# Patient Record
Sex: Male | Born: 1975 | Race: Asian | Hispanic: No | Marital: Married | Smoking: Former smoker
Health system: Southern US, Community
[De-identification: ages and names within clinical notes are randomized; demographics above are authoritative.]

## PROBLEM LIST (undated history)

## (undated) DIAGNOSIS — I712 Thoracic aortic aneurysm, without rupture, unspecified: Secondary | ICD-10-CM

## (undated) DIAGNOSIS — F329 Major depressive disorder, single episode, unspecified: Secondary | ICD-10-CM

## (undated) DIAGNOSIS — F419 Anxiety disorder, unspecified: Secondary | ICD-10-CM

## (undated) DIAGNOSIS — T7840XA Allergy, unspecified, initial encounter: Secondary | ICD-10-CM

## (undated) DIAGNOSIS — F32A Depression, unspecified: Secondary | ICD-10-CM

## (undated) DIAGNOSIS — G4730 Sleep apnea, unspecified: Secondary | ICD-10-CM

## (undated) DIAGNOSIS — I71019 Dissection of thoracic aorta, unspecified: Secondary | ICD-10-CM

## (undated) DIAGNOSIS — I7101 Dissection of thoracic aorta: Secondary | ICD-10-CM

## (undated) DIAGNOSIS — E785 Hyperlipidemia, unspecified: Secondary | ICD-10-CM

## (undated) DIAGNOSIS — I10 Essential (primary) hypertension: Secondary | ICD-10-CM

## (undated) HISTORY — DX: Dissection of thoracic aorta: I71.01

## (undated) HISTORY — DX: Allergy, unspecified, initial encounter: T78.40XA

## (undated) HISTORY — DX: Depression, unspecified: F32.A

## (undated) HISTORY — DX: Sleep apnea, unspecified: G47.30

## (undated) HISTORY — PX: CARDIAC VALVE REPLACEMENT: SHX585

## (undated) HISTORY — DX: Anxiety disorder, unspecified: F41.9

## (undated) HISTORY — DX: Dissection of thoracic aorta, unspecified: I71.019

## (undated) HISTORY — DX: Thoracic aortic aneurysm, without rupture: I71.2

## (undated) HISTORY — DX: Hyperlipidemia, unspecified: E78.5

## (undated) HISTORY — DX: Essential (primary) hypertension: I10

## (undated) HISTORY — DX: Thoracic aortic aneurysm, without rupture, unspecified: I71.20

---
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
## 2017-06-13 DIAGNOSIS — E559 Vitamin D deficiency, unspecified: Secondary | ICD-10-CM | POA: Insufficient documentation

## 2017-10-06 DIAGNOSIS — I351 Nonrheumatic aortic (valve) insufficiency: Secondary | ICD-10-CM | POA: Insufficient documentation

## 2017-11-03 DIAGNOSIS — K760 Fatty (change of) liver, not elsewhere classified: Secondary | ICD-10-CM

## 2017-11-03 DIAGNOSIS — I7121 Aneurysm of the ascending aorta, without rupture: Secondary | ICD-10-CM | POA: Insufficient documentation

## 2017-11-03 HISTORY — DX: Fatty (change of) liver, not elsewhere classified: K76.0

## 2017-11-21 HISTORY — PX: OTHER SURGICAL HISTORY: SHX169

## 2017-12-03 DIAGNOSIS — Z9889 Other specified postprocedural states: Secondary | ICD-10-CM | POA: Insufficient documentation

## 2017-12-03 DIAGNOSIS — Z7901 Long term (current) use of anticoagulants: Secondary | ICD-10-CM | POA: Insufficient documentation

## 2017-12-03 DIAGNOSIS — Z8679 Personal history of other diseases of the circulatory system: Secondary | ICD-10-CM

## 2017-12-03 HISTORY — DX: Other specified postprocedural states: Z98.890

## 2017-12-03 HISTORY — DX: Personal history of other diseases of the circulatory system: Z86.79

## 2017-12-06 DIAGNOSIS — Z952 Presence of prosthetic heart valve: Secondary | ICD-10-CM | POA: Insufficient documentation

## 2019-03-06 ENCOUNTER — Ambulatory Visit: Payer: Self-pay | Admitting: Family Medicine

## 2019-03-12 ENCOUNTER — Ambulatory Visit: Payer: BC Managed Care – PPO | Admitting: Family Medicine

## 2019-03-12 ENCOUNTER — Encounter: Payer: Self-pay | Admitting: Family Medicine

## 2019-03-12 VITALS — BP 112/78 | HR 68 | Temp 98.2°F | Ht 66.0 in | Wt 180.8 lb

## 2019-03-12 DIAGNOSIS — E236 Other disorders of pituitary gland: Secondary | ICD-10-CM

## 2019-03-12 DIAGNOSIS — E785 Hyperlipidemia, unspecified: Secondary | ICD-10-CM

## 2019-03-12 DIAGNOSIS — Z952 Presence of prosthetic heart valve: Secondary | ICD-10-CM

## 2019-03-12 DIAGNOSIS — Z7901 Long term (current) use of anticoagulants: Secondary | ICD-10-CM

## 2019-03-12 DIAGNOSIS — I719 Aortic aneurysm of unspecified site, without rupture: Secondary | ICD-10-CM

## 2019-03-12 DIAGNOSIS — I7121 Aneurysm of the ascending aorta, without rupture: Secondary | ICD-10-CM

## 2019-03-12 DIAGNOSIS — F418 Other specified anxiety disorders: Secondary | ICD-10-CM | POA: Insufficient documentation

## 2019-03-12 HISTORY — DX: Hyperlipidemia, unspecified: E78.5

## 2019-03-12 HISTORY — DX: Presence of prosthetic heart valve: Z95.2

## 2019-03-12 NOTE — Progress Notes (Signed)
Larry Parks - 43 y.o. male MRN [id number]  Date of birth: 02-11-1976  Subjective Chief Complaint  Patient presents with  . Establish Care    est care/ no complaints    HPI Larry Parks is a 43 y.o. 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  He reports that his chronic medical conditions are stable.  He needs to establish with cardiology for anticoagulation management and continued monitoring of AVR.  His last INR was 02/20/2019 and was 2.2.    He also reports that shortly after his surgery he had some dizziness and ended up in the ER.  He had and MRI that showed pituitary abnormality.  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  He has not had any symptoms related to this and endocrine labs were normal previously.    ROS:  A comprehensive ROS was completed and negative except as noted per HPI  No Known Allergies  Past Medical History:  Diagnosis Date  . Hyperlipidemia     Past Surgical History:  Procedure Laterality Date  . aortic valve replaced      Social History   Socioeconomic History  . Marital status: Married    Spouse name: Not on file  . Number of children: Not on file  . Years of education: Not on file  . Highest education level: Not on file  Occupational History  . Not on file  Social Needs  . Financial resource strain: Not on file  . Food insecurity    Worry: Not on file    Inability: Not on file  . Transportation needs    Medical: Not on file    Non-medical: Not on file  Tobacco Use  . Smoking status: Former Smoker    Quit date: 2004    Years since quitting: 16.5  . Smokeless tobacco: Never Used  Substance and Sexual Activity  . Alcohol use: Never    Frequency: Never  . Drug use: Never  . Sexual activity: Not on file  Lifestyle  . Physical activity    Days per week: Not on file    Minutes per  session: Not on file  . Stress: Not on file  Relationships  . Social Herbalist on phone: Not on file    Gets together: Not on file    Attends religious service: Not on file    Active member of club or organization: Not on file    Attends meetings of clubs or organizations: Not on file    Relationship status: Not on file  Other Topics Concern  . Not on file  Social History Narrative  . Not on file    Family History  Problem Relation Age of Onset  . Diabetes Father   . Heart attack Father     Health Maintenance  Topic Date Due  . HIV Screening  08/28/1988  . INFLUENZA VACCINE  03/24/2019  . TETANUS/TDAP  02/01/2027    ----------------------------------------------------------------------------------------------------------------------------------------------------------------------------------------------------------------- Physical Exam There were no vitals taken for this visit.  Physical Exam Constitutional:      Appearance: Normal appearance.  HENT:     Head: Normocephalic and atraumatic.     Mouth/Throat:     Mouth: Mucous membranes are moist.  Eyes:     General: No scleral icterus. Neck:     Musculoskeletal: Neck supple.  Cardiovascular:  Rate and Rhythm: Normal rate and regular rhythm.     Comments: Mechanical AV click Pulmonary:     Effort: Pulmonary effort is normal.     Breath sounds: Normal breath sounds.  Skin:    General: Skin is warm and dry.  Neurological:     General: No focal deficit present.     Mental Status: He is alert.  Psychiatric:        Mood and Affect: Mood normal.        Behavior: Behavior normal.     ------------------------------------------------------------------------------------------------------------------------------------------------------------------------------------------------------------------- Assessment and Plan  Aortic root aneurysm ([place]) -Referral to cardiology as he was instructed to have 1 year  f/u after AVR.  -Will also need ongoing anticoagulation monitoring.  -  Pituitary cyst ([place]) -Will continue to monitor with routine monitoring of pituitary hormones.    Hyperlipidemia -Tolerating zetia, continue.   Depression with anxiety -taking 1/2 tab of lexapro 10mg .  Never took lorazepam.  He feels that symptoms are well controlled at this time.   >45 minutes spent with patient with 50% of time spent providing counseling and/or coordination of care through review of records and referrals as outlined above.

## 2019-03-12 NOTE — Patient Instructions (Signed)
Very nice to meet you! Continue current medications and see me again in 6 months.  If you do not hear from cardiology/warfarin clinic in the next couple of weeks let me know.

## 2019-03-12 NOTE — Assessment & Plan Note (Signed)
-  taking 1/2 tab of lexapro 10mg .  Never took lorazepam.  He feels that symptoms are well controlled at this time.

## 2019-03-12 NOTE — Assessment & Plan Note (Signed)
-  Referral to cardiology as he was instructed to have 1 year f/u after AVR.  -Will also need ongoing anticoagulation monitoring.  -

## 2019-03-12 NOTE — Assessment & Plan Note (Signed)
-  Tolerating zetia, continue.

## 2019-03-12 NOTE — Assessment & Plan Note (Signed)
-  Will continue to monitor with routine monitoring of pituitary hormones.

## 2019-03-21 ENCOUNTER — Ambulatory Visit (INDEPENDENT_AMBULATORY_CARE_PROVIDER_SITE_OTHER): Payer: BC Managed Care – PPO | Admitting: Cardiology

## 2019-03-21 ENCOUNTER — Other Ambulatory Visit: Payer: Self-pay

## 2019-03-21 ENCOUNTER — Encounter: Payer: Self-pay | Admitting: Cardiology

## 2019-03-21 VITALS — BP 108/64 | HR 64 | Resp 10 | Ht 66.0 in | Wt 180.8 lb

## 2019-03-21 DIAGNOSIS — Z7901 Long term (current) use of anticoagulants: Secondary | ICD-10-CM

## 2019-03-21 DIAGNOSIS — I719 Aortic aneurysm of unspecified site, without rupture: Secondary | ICD-10-CM

## 2019-03-21 DIAGNOSIS — E785 Hyperlipidemia, unspecified: Secondary | ICD-10-CM | POA: Diagnosis not present

## 2019-03-21 DIAGNOSIS — Z952 Presence of prosthetic heart valve: Secondary | ICD-10-CM

## 2019-03-21 DIAGNOSIS — F418 Other specified anxiety disorders: Secondary | ICD-10-CM | POA: Diagnosis not present

## 2019-03-21 DIAGNOSIS — I7121 Aneurysm of the ascending aorta, without rupture: Secondary | ICD-10-CM

## 2019-03-21 NOTE — Patient Instructions (Addendum)
Medication Instructions:  Your physician recommends that you continue on your current medications as directed. Please refer to the Current Medication list given to you today.  If you need a refill on your cardiac medications before your next appointment, please call your pharmacy.   Lab work: Your physician recommends that you return for lab work today: lipids, bmp, inr   If you have labs (blood work) drawn today and your tests are completely normal, you will receive your results only by: Marland Kitchen MyChart Message (if you have MyChart) OR . A paper copy in the mail If you have any lab test that is abnormal or we need to change your treatment, we will call you to review the results.  Testing/Procedures: Your physician has requested that you have an echocardiogram. Echocardiography is a painless test that uses sound waves to create images of your heart. It provides your doctor with information about the size and shape of your heart and how well your heart's chambers and valves are working. This procedure takes approximately one hour. There are no restrictions for this procedure.  Non-Cardiac CT scanning, (CAT scanning), is a noninvasive, special x-ray that produces cross-sectional images of the body using x-rays and a computer. CT scans help physicians diagnose and treat medical conditions. For some CT exams, a contrast material is used to enhance visibility in the area of the body being studied. CT scans provide greater clarity and reveal more details than regular x-ray exams.    Follow-Up: At [facility], you and your health needs are our priority.  As part of our continuing mission to provide you with exceptional heart care, we have created designated Provider Care Teams.  These Care Teams include your primary Cardiologist (physician) and Advanced Practice Providers (APPs -  Physician Assistants and Nurse Practitioners) who all work together to provide you with the care you need, when you need  it. You will need a follow up appointment in 6 months.  Please call our office 2 months in advance to schedule this appointment.  You may see No primary care provider on file. or another member of our [employer] Provider Team in [place]: Shirlee More, MD . Jyl Heinz, MD  Any Other Special Instructions Will Be Listed Below (If Applicable).  Dr. Agustin Cree will have you enrolled in coumadin clinic they will call you within a week, if not please call our office.   Echocardiogram An echocardiogram is a procedure that uses painless sound waves (ultrasound) to produce an image of the heart. Images from an echocardiogram can provide important information about:  Signs of coronary artery disease (CAD).  Aneurysm detection. An aneurysm is a weak or damaged part of an artery wall that bulges out from the normal force of blood pumping through the body.  Heart size and shape. Changes in the size or shape of the heart can be associated with certain conditions, including heart failure, aneurysm, and CAD.  Heart muscle function.  Heart valve function.  Signs of a past heart attack.  Fluid buildup around the heart.  Thickening of the heart muscle.  A tumor or infectious growth around the heart valves. Tell a health care provider about:  Any allergies you have.  All medicines you are taking, including vitamins, herbs, eye drops, creams, and over-the-counter medicines.  Any blood disorders you have.  Any surgeries you have had.  Any medical conditions you have.  Whether you are pregnant or may be pregnant. What are the risks? Generally, this is a  safe procedure. However, problems may occur, including:  Allergic reaction to dye (contrast) that may be used during the procedure. What happens before the procedure? No specific preparation is needed. You may eat and drink normally. What happens during the procedure?   An IV tube may be inserted into one of your veins.  You may  receive contrast through this tube. A contrast is an injection that improves the quality of the pictures from your heart.  A gel will be applied to your chest.  A wand-like tool (transducer) will be moved over your chest. The gel will help to transmit the sound waves from the transducer.  The sound waves will harmlessly bounce off of your heart to allow the heart images to be captured in real-time motion. The images will be recorded on a computer. The procedure may vary among health care providers and hospitals. What happens after the procedure?  You may return to your normal, everyday life, including diet, activities, and medicines, unless your health care provider tells you not to do that. Summary  An echocardiogram is a procedure that uses painless sound waves (ultrasound) to produce an image of the heart.  Images from an echocardiogram can provide important information about the size and shape of your heart, heart muscle function, heart valve function, and fluid buildup around your heart.  You do not need to do anything to prepare before this procedure. You may eat and drink normally.  After the echocardiogram is completed, you may return to your normal, everyday life, unless your health care provider tells you not to do that. This information is not intended to replace advice given to you by your health care provider. Make sure you discuss any questions you have with your health care provider. Document Released: 08/06/2000 Document Revised: 11/30/2018 Document Reviewed: 09/11/2016 Elsevier Patient Education  2020 [employer].

## 2019-03-21 NOTE — Progress Notes (Signed)
Cardiology Consultation:    Date:  03/21/2019   ID:  Larry Parks, DOB [birth year], MRN [id number]  PCP:  Luetta Nutting, DO  Cardiologist:  Jenne Campus, MD   Referring MD: Luetta Nutting, DO   Chief Complaint  Patient presents with  . Aortic root aneurysim    History of Present Illness:    Larry Parks is a 43 y.o. male who is being seen today for the evaluation of status post aortic valve replacement at the request of Luetta Nutting, DO.  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  In April of last year he underwent aortic root replacement with aortic valve replacement with On-X 23 mm valve.  He did well with the surgery after that he gets some bouts of depression managed successfully with psychotherapy as well as Lexapro.  Now he is doing very well.  He exercised on the regular basis.  He walks we have no difficulty doing it.  Denies having any chest pain tightness squeezing pressure burning chest.  After surgery he had episode of palpitations which were forceful pounding in the chest lasting for a few seconds but that is not happening lately. He would like to be established in our clinic as a patient.  Past Medical History:  Diagnosis Date  . Hyperlipidemia     Past Surgical History:  Procedure Laterality Date  . aortic valve replaced      Current Medications: Current Meds  Medication Sig  . Cholecalciferol (VITAMIN D3) 1.25 MG (50000 UT) CAPS Take by mouth.  . co-enzyme Q-10 30 MG capsule Take 30 mg by mouth 3 (three) times daily.  Marland Kitchen escitalopram (LEXAPRO) 10 MG tablet 5 mg. Pt is taking 5mg   . ezetimibe (ZETIA) 10 MG tablet Take by mouth.  . warfarin (COUMADIN) 5 MG tablet Pt taking 7.5 mg Tuesday Thursday Saturday and Sunday 10 mg Mon Wed Friday     Allergies:   Patient has no known allergies.   Social History   Socioeconomic History  . Marital status: Married    Spouse  name: Not on file  . Number of children: Not on file  . Years of education: Not on file  . Highest education level: Not on file  Occupational History  . Not on file  Social Needs  . Financial resource strain: Not on file  . Food insecurity    Worry: Not on file    Inability: Not on file  . Transportation needs    Medical: Not on file    Non-medical: Not on file  Tobacco Use  . Smoking status: Former Smoker    Quit date: 2004    Years since quitting: 16.5  . Smokeless tobacco: Never Used  Substance and Sexual Activity  . Alcohol use: Never    Frequency: Never  . Drug use: Never  . Sexual activity: Not on file  Lifestyle  . Physical activity    Days per week: Not on file    Minutes per session: Not on file  . Stress: Not on file  Relationships  . Social Herbalist on phone: Not on file    Gets together: Not on file    Attends religious service: Not on file    Active member of club or organization: Not on file    Attends meetings of clubs or organizations: Not on file    Relationship status: Not on file  Other Topics Concern  . Not on file  Social History Narrative  . Not on file     Family History: The patient's family history includes Diabetes in his father; Heart attack in his father. ROS:   Please see the history of present illness.    All 14 point review of systems negative except as described per history of present illness.  EKGs/Labs/Other Studies Reviewed:    The following studies were reviewed today:   EKG:  EKG is  ordered today.  The ekg ordered today demonstrates normal sinus rhythm left axis, nonspecific ST segment changes  Recent Labs: No results found for requested labs within last 8760 hours.  Recent Lipid Panel No results found for: CHOL, TRIG, HDL, CHOLHDL, VLDL, LDLCALC, LDLDIRECT  Physical Exam:    VS:  BP 108/64   Pulse 64   Resp 10   Ht 5\' 6"  (1.676 m)   Wt 180 lb 12.8 oz (82 kg)   BMI 29.18 kg/m     Wt Readings from  Last 3 Encounters:  03/21/19 180 lb 12.8 oz (82 kg)  03/12/19 180 lb 12.8 oz (82 kg)     GEN:  Well nourished, well developed in no acute distress HEENT: Normal NECK: No JVD; No carotid bruits LYMPHATICS: No lymphadenopathy CARDIAC: Crisp mechanical valve sounds, no murmur, no murmurs, no rubs, no gallops RESPIRATORY:  Clear to auscultation without rales, wheezing or rhonchi  ABDOMEN: Soft, non-tender, non-distended MUSCULOSKELETAL:  No edema; No deformity  SKIN: Warm and dry NEUROLOGIC:  Alert and oriented x 3 PSYCHIATRIC:  Normal affect   ASSESSMENT:    1. Aortic root aneurysm ([place])   2. Current use of long term anticoagulation   3. Depression with anxiety   4. Hyperlipidemia, unspecified hyperlipidemia type   5. S/P AVR 23 mm Onyx 2019 April    PLAN:    In order of problems listed above:  1. Status post aortic aneurysm repair involving ascending aorta.  Doing well from that point review I will do a CT of his chest to make sure the graft is functioning properly. 2. Anticoagulation use will continue target INR is 2-3 3. Depression with anxiety.  That being successfully managed by psychotherapy as well as Lexapro he is doing very well from that point review. 4. Hyperlipidemia he was discovered to have high lipids will check his fasting lipid profile today.  Continue Zetia 5. Status post aortic valve replacement with 23 mm On-X valve.  Valve sounds good echocardiogram will be done   Medication Adjustments/Labs and Tests Ordered: Current medicines are reviewed at length with the patient today.  Concerns regarding medicines are outlined above.  No orders of the defined types were placed in this encounter.  No orders of the defined types were placed in this encounter.   Signed, Park Liter, MD, [facility]. 03/21/2019 [facility]

## 2019-03-21 NOTE — Addendum Note (Signed)
Addended by: Ashok Norris on: 03/21/2019 04:39 PM   Modules accepted: Orders

## 2019-03-21 NOTE — Addendum Note (Signed)
Addended by: Ashok Norris on: 03/21/2019 04:58 PM   Modules accepted: Orders

## 2019-03-22 LAB — LIPID PANEL
Chol/HDL Ratio: 7.3 ratio — ABNORMAL HIGH (ref 0.0–5.0)
Cholesterol, Total: 203 mg/dL — ABNORMAL HIGH (ref 100–199)
HDL: 28 mg/dL — ABNORMAL LOW (ref >39)
Triglycerides: 451 mg/dL — ABNORMAL HIGH (ref 0–149)

## 2019-03-22 LAB — BASIC METABOLIC PANEL
BUN/Creatinine Ratio: 15 (ref 9–20)
BUN: 11 mg/dL (ref 6–24)
CO2: 24 mmol/L (ref 20–29)
Calcium: 9.2 mg/dL (ref 8.7–10.2)
Chloride: 101 mmol/L (ref 96–106)
Creatinine, Ser: 0.75 mg/dL — ABNORMAL LOW (ref 0.76–1.27)
GFR calc Af Amer: 128 mL/min/{1.73_m2} (ref >59)
GFR calc non Af Amer: 111 mL/min/{1.73_m2} (ref >59)
Glucose: 81 mg/dL (ref 65–99)
Potassium: 4.2 mmol/L (ref 3.5–5.2)
Sodium: 137 mmol/L (ref 134–144)

## 2019-03-22 LAB — PROTIME-INR
INR: 2.7 — ABNORMAL HIGH (ref 0.8–1.2)
Prothrombin Time: 27.6 s — ABNORMAL HIGH (ref 9.1–12.0)

## 2019-03-23 ENCOUNTER — Ambulatory Visit (HOSPITAL_BASED_OUTPATIENT_CLINIC_OR_DEPARTMENT_OTHER)
Admission: RE | Admit: 2019-03-23 | Discharge: 2019-03-23 | Disposition: A | Discharge status: Home / Self Care | Payer: BC Managed Care – PPO | Source: Ambulatory Visit | Attending: Cardiology | Admitting: Cardiology

## 2019-03-23 ENCOUNTER — Other Ambulatory Visit: Payer: Self-pay

## 2019-03-23 ENCOUNTER — Other Ambulatory Visit (INDEPENDENT_AMBULATORY_CARE_PROVIDER_SITE_OTHER): Payer: BC Managed Care – PPO

## 2019-03-23 DIAGNOSIS — I719 Aortic aneurysm of unspecified site, without rupture: Secondary | ICD-10-CM | POA: Insufficient documentation

## 2019-03-23 DIAGNOSIS — Z7901 Long term (current) use of anticoagulants: Secondary | ICD-10-CM

## 2019-03-23 MED ORDER — IOHEXOL 350 MG/ML SOLN
100.0000 mL | Freq: Once | INTRAVENOUS | Status: AC | PRN
  Administered 2019-03-23: 100 mL via INTRAVENOUS

## 2019-03-29 ENCOUNTER — Ambulatory Visit (INDEPENDENT_AMBULATORY_CARE_PROVIDER_SITE_OTHER): Payer: BC Managed Care – PPO | Admitting: Pharmacist

## 2019-03-29 ENCOUNTER — Other Ambulatory Visit: Payer: Self-pay

## 2019-03-29 DIAGNOSIS — Z7901 Long term (current) use of anticoagulants: Secondary | ICD-10-CM | POA: Diagnosis not present

## 2019-03-29 DIAGNOSIS — I719 Aortic aneurysm of unspecified site, without rupture: Secondary | ICD-10-CM

## 2019-03-29 DIAGNOSIS — I7121 Aneurysm of the ascending aorta, without rupture: Secondary | ICD-10-CM

## 2019-03-29 DIAGNOSIS — Z952 Presence of prosthetic heart valve: Secondary | ICD-10-CM | POA: Diagnosis not present

## 2019-03-29 LAB — POCT INR: INR: 2.9 (ref 2.0–3.0)

## 2019-03-29 NOTE — Patient Instructions (Signed)
Continue taking warfarin 7.5mg  daily except for 10mg  Mondays, Wednesday and Fridays. Call clinic at [phone] for changes in medication or procedures

## 2019-04-02 ENCOUNTER — Ambulatory Visit (HOSPITAL_BASED_OUTPATIENT_CLINIC_OR_DEPARTMENT_OTHER): Payer: BC Managed Care – PPO

## 2019-04-03 ENCOUNTER — Other Ambulatory Visit: Payer: Self-pay

## 2019-04-03 ENCOUNTER — Ambulatory Visit (HOSPITAL_BASED_OUTPATIENT_CLINIC_OR_DEPARTMENT_OTHER)
Admission: RE | Admit: 2019-04-03 | Discharge: 2019-04-03 | Disposition: A | Discharge status: Home / Self Care | Payer: BC Managed Care – PPO | Source: Ambulatory Visit | Attending: Cardiology | Admitting: Cardiology

## 2019-04-03 DIAGNOSIS — I719 Aortic aneurysm of unspecified site, without rupture: Secondary | ICD-10-CM | POA: Insufficient documentation

## 2019-04-03 NOTE — Progress Notes (Signed)
  Echocardiogram 2D Echocardiogram has been performed.  Cardell Peach 04/03/2019, 3:11 PM

## 2019-04-27 ENCOUNTER — Other Ambulatory Visit: Payer: Self-pay

## 2019-04-27 ENCOUNTER — Ambulatory Visit (INDEPENDENT_AMBULATORY_CARE_PROVIDER_SITE_OTHER): Payer: BC Managed Care – PPO | Admitting: Pharmacist Clinician (PhC)/ Clinical Pharmacy Specialist

## 2019-04-27 DIAGNOSIS — I7121 Aneurysm of the ascending aorta, without rupture: Secondary | ICD-10-CM

## 2019-04-27 DIAGNOSIS — I719 Aortic aneurysm of unspecified site, without rupture: Secondary | ICD-10-CM

## 2019-04-27 DIAGNOSIS — Z7901 Long term (current) use of anticoagulants: Secondary | ICD-10-CM

## 2019-04-27 DIAGNOSIS — Z952 Presence of prosthetic heart valve: Secondary | ICD-10-CM

## 2019-04-27 LAB — POCT INR: INR: 3.3 — AB (ref 2.0–3.0)

## 2019-05-03 ENCOUNTER — Ambulatory Visit (INDEPENDENT_AMBULATORY_CARE_PROVIDER_SITE_OTHER): Payer: BC Managed Care – PPO

## 2019-05-03 ENCOUNTER — Other Ambulatory Visit: Payer: Self-pay

## 2019-05-03 DIAGNOSIS — Z23 Encounter for immunization: Secondary | ICD-10-CM | POA: Diagnosis not present

## 2019-05-11 ENCOUNTER — Encounter: Payer: Self-pay | Admitting: Family Medicine

## 2019-05-14 ENCOUNTER — Encounter: Payer: Self-pay | Admitting: Family Medicine

## 2019-05-14 ENCOUNTER — Other Ambulatory Visit: Payer: Self-pay | Admitting: Family Medicine

## 2019-05-14 DIAGNOSIS — G4733 Obstructive sleep apnea (adult) (pediatric): Secondary | ICD-10-CM

## 2019-05-16 ENCOUNTER — Encounter: Payer: Self-pay | Admitting: Family Medicine

## 2019-05-17 MED ORDER — EZETIMIBE 10 MG PO TABS: 10.0000 mg | ORAL_TABLET | Freq: Every day | ORAL | 1 refills | Status: DC

## 2019-05-18 ENCOUNTER — Other Ambulatory Visit: Payer: Self-pay

## 2019-05-18 ENCOUNTER — Ambulatory Visit (INDEPENDENT_AMBULATORY_CARE_PROVIDER_SITE_OTHER): Payer: BC Managed Care – PPO | Admitting: Pharmacist

## 2019-05-18 DIAGNOSIS — Z7901 Long term (current) use of anticoagulants: Secondary | ICD-10-CM

## 2019-05-18 DIAGNOSIS — Z952 Presence of prosthetic heart valve: Secondary | ICD-10-CM | POA: Diagnosis not present

## 2019-05-18 DIAGNOSIS — I7121 Aneurysm of the ascending aorta, without rupture: Secondary | ICD-10-CM

## 2019-05-18 DIAGNOSIS — I719 Aortic aneurysm of unspecified site, without rupture: Secondary | ICD-10-CM

## 2019-05-18 LAB — POCT INR: INR: 2.6 (ref 2.0–3.0)

## 2019-05-22 ENCOUNTER — Encounter: Payer: Self-pay | Admitting: Neurology

## 2019-05-22 ENCOUNTER — Other Ambulatory Visit: Payer: Self-pay

## 2019-05-22 ENCOUNTER — Ambulatory Visit: Payer: BC Managed Care – PPO | Admitting: Neurology

## 2019-05-22 VITALS — BP 120/75 | HR 73 | Ht 66.0 in | Wt 188.0 lb

## 2019-05-22 DIAGNOSIS — G4733 Obstructive sleep apnea (adult) (pediatric): Secondary | ICD-10-CM

## 2019-05-22 DIAGNOSIS — Z9989 Dependence on other enabling machines and devices: Secondary | ICD-10-CM

## 2019-05-22 NOTE — Patient Instructions (Signed)
You report ongoing good results with your AutoPap machine and compliance with treatment.  Your office records from your sleep follow-up appointment in March also indicates full compliance.  Please continue with your AutoPap therapy.  Please call your [facility] office, tell them that you have moved, they can tag [place] in [employer], and then the [employer] office can fax [place] a Download from your machine or we should be able to access it online wirelessly after that.  Your home office phone number is [phone].   You can follow-up in 1 year.

## 2019-05-22 NOTE — Progress Notes (Signed)
Order for cpap supplies sent to [facility] via fax. Confirmation received that the order transmitted was successful.

## 2019-05-22 NOTE — Progress Notes (Signed)
Subjective:    Patient ID: Larry Parks is a 43 y.o. male.  HPI     Star Age, MD, PhD [facility] [address], [address] [place], [place] [address]  Dear Dr. Rodena Piety, I saw your patient, Larry Parks, upon your kind request to my sleep clinic today for initial consultation of his sleep disorder, in particular, evaluation of his OSA to establish care, post relocation.  The patient is unaccompanied today.  As you know, Mr. Larry Parks is a 43 year old right-handed gentleman with an underlying medical history of aortic root aneurysm, status post aortic valve replacement, status post ascending aortic aneurysm repair, vitamin D deficiency, hyperlipidemia, and overweight state, who was diagnosed with obstructive sleep apnea last year.  He was placed on an AutoPap therapy.  I reviewed his home sleep test report from 06/24/2018.  He was diagnosed with severe sleep apnea based on a respiratory event index of 74.4/h, desaturation nadir was 65%.  He has been on AutoPap therapy.  An updated AutoPap download was not possible.  He brought his machine but we could not access the data.  We called his DME company, the [employer] office and they could not assist Larry Parks, they reported that he would have to call his home office in [place] and have them transfer the records to the [employer].  We provided the patient with the phone number for his [place] home office.  He has been using AutoPap compliantly.  I reviewed his sleep clinic follow-up note from March 2020 when he saw Bing Ree, nurse practitioner.  He was fully compliant with treatment, average usage was nearly 8 hours, apnea on average right around 5.9/h, he has been using a medium [employer] FFM with good success.  When he first started using his AutoPap he felt great improvement in his daytime somnolence and sleep quality.  He indicates ongoing full compliance.  His bedtime is late, around 2 AM and rise time  around 10 AM.  He teaches at [employer].  He lives with his family, which includes his wife and 2 girls, ages 42 and 39 months.  He quit smoking several years ago and does not drink any alcohol, quit a long time ago he indicates.  He drinks caffeine and limitation in the form of coffee, 1 cup/day and tea, 1 cup/day on average.  He denies any significant daytime somnolence, Epworth sleepiness score is 6 out of 24, fatigue severity score is 21 out of 63.  He is up-to-date with his supplies.    His Past Medical History Is Significant For: Past Medical History:  Diagnosis Date  . Hyperlipidemia     His Past Surgical History Is Significant For: Past Surgical History:  Procedure Laterality Date  . aortic valve replaced      His Family History Is Significant For: Family History  Problem Relation Age of Onset  . Diabetes Father   . Heart attack Father     His Social History Is Significant For: Social History   Socioeconomic History  . Marital status: Married    Spouse name: Not on file  . Number of children: Not on file  . Years of education: Not on file  . Highest education level: Not on file  Occupational History  . Not on file  Social Needs  . Financial resource strain: Not on file  . Food insecurity    Worry: Not on file    Inability: Not on file  . Transportation needs  Medical: Not on file    Non-medical: Not on file  Tobacco Use  . Smoking status: Former Smoker    Quit date: 2004    Years since quitting: 16.7  . Smokeless tobacco: Never Used  Substance and Sexual Activity  . Alcohol use: Never    Frequency: Never  . Drug use: Never  . Sexual activity: Not on file  Lifestyle  . Physical activity    Days per week: Not on file    Minutes per session: Not on file  . Stress: Not on file  Relationships  . Social Herbalist on phone: Not on file    Gets together: Not on file    Attends religious service: Not on file    Active member of club or organization:  Not on file    Attends meetings of clubs or organizations: Not on file    Relationship status: Not on file  Other Topics Concern  . Not on file  Social History Narrative  . Not on file    His Allergies Are:  No Known Allergies:   His Current Medications Are:  Outpatient Encounter Medications as of 05/22/2019  Medication Sig  . Cholecalciferol (VITAMIN D3) 1.25 MG (50000 UT) CAPS Take by mouth.  . co-enzyme Q-10 30 MG capsule Take 30 mg by mouth 3 (three) times daily.  Marland Kitchen escitalopram (LEXAPRO) 10 MG tablet 5 mg. Pt is taking 5mg   . ezetimibe (ZETIA) 10 MG tablet Take 1 tablet (10 mg total) by mouth daily.  Marland Kitchen warfarin (COUMADIN) 5 MG tablet Pt taking 7.5 mg Tuesday Thursday Saturday and Sunday 10 mg Mon Wed Friday   No facility-administered encounter medications on file as of 05/22/2019.   :  Review of Systems:  Out of a complete 14 point review of systems, all are reviewed and negative with the exception of these symptoms as listed below: Review of Systems  Neurological:       Pt presents today to establish care for his cpap. Pt received his cpap last year. He uses [employer] as his DME. We are unable to download pt's cpap data today.  Epworth Sleepiness Scale 0= would never doze 1= slight chance of dozing 2= moderate chance of dozing 3= high chance of dozing  Sitting and reading: 1 Watching TV: 0 Sitting inactive in a public place (ex. Theater or meeting): 1 As a passenger in a car for an hour without a break: 2 Lying down to rest in the afternoon: 2 Sitting and talking to someone: 0 Sitting quietly after lunch (no alcohol): 0 In a car, while stopped in traffic: 0 Total: 6     Objective:  Neurological Exam  Physical Exam Physical Examination:   Vitals:   05/22/19 1330  BP: 120/75  Pulse: 73   General Examination: The patient is a very pleasant 43 y.o. male in no acute distress. He appears well-developed and well-nourished and well groomed.   HEENT:  Normocephalic, atraumatic, pupils are equal, round and reactive to light. Extraocular tracking is good without limitation to gaze excursion or nystagmus noted. Normal smooth pursuit is noted. Hearing is grossly intact. Face is symmetric with normal facial animation. Speech is clear with no dysarthria noted. There is no hypophonia. There is no lip, neck/head, jaw or voice tremor. Neck is supple with full range of passive and active motion. There are no carotid bruits on auscultation. Oropharynx exam reveals: mild mouth dryness, adequate dental hygiene and marked airway crowding, due to Small  airway entry, thicker soft palate, larger tonsils of about 3+ bilaterally.  Mallampati is class II, neck circumference is16 and three-quarter inches.  Tongue protrudes centrally in palate elevates symmetrically  Chest: Clear to auscultation without wheezing, rhonchi or crackles noted.  Heart: S1+S2+0, regular and normal without murmurs, rubs or gallops noted.   Abdomen: Soft, non-tender and non-distended with normal bowel sounds appreciated on auscultation.  Extremities: There is no pitting edema in the distal lower extremities bilaterally.   Skin: Warm and dry without trophic changes noted.  Musculoskeletal: exam reveals no obvious joint deformities, tenderness or joint swelling or erythema.   Neurologically:  Mental status: The patient is awake, alert and oriented in all 4 spheres. His immediate and remote memory, attention, language skills and fund of knowledge are appropriate. There is no evidence of aphasia, agnosia, apraxia or anomia. Speech is clear with normal prosody and enunciation. Thought process is linear. Mood is normal and affect is normal.  Cranial nerves II - XII are as described above under HEENT exam. In addition: shoulder shrug is normal with equal shoulder height noted. Motor exam: Normal bulk, strength and tone is noted. There is no tremor. Fine motor skills and coordination: grossly intact.   Cerebellar testing: No dysmetria or intention tremor. Sensory exam: intact to light touch in the upper and lower extremities.  Gait, station and balance: He stands easily. No veering to one side is noted. No leaning to one side is noted. Posture is age-appropriate and stance is narrow based. Gait shows normal stride length and normal pace. No problems turning are noted.   Assessment and plan:  In summary, Estes Harbold is a very pleasant 42 y.o.-year old male with an underlying medical history of aortic root aneurysm, status post aortic valve replacement, status post ascending aortic aneurysm repair, vitamin D deficiency, hyperlipidemia, and overweight state, who Presents for establishing care for sleep apnea management.  He was diagnosed with severe obstructive sleep apnea via home sleep test in November 2019.  He was residing in [place], [place] at the time.  He has since then relocated to [facility].  He indicates full compliance with his AutoPap.  His sleep clinic follow-up visit from March 2020 indicated full compliance with AutoPap therapy and good treatment settings.  His average AutoPap pressure is around 15.6 cm.  We will try to get a more updated download from his local DME company.  He will have to transfer his records from the [place] office to the [employer].  He was provided the phone number for his [place] home office and hopefully,We will be able to get an updated download soon.  He indicates good results from using AutoPap and is fully compliant with treatment, tolerates the settings and is up-to-date with his supplies.  I did order new supplies which we will request through the [facility] DME office.  He is advised to follow-up routinely in 1 year, sooner if needed.  I answered all his questions today and he was in agreement.  Thank you very much for allowing me to participate in the care of this nice patient. If I can be of any further assistance to you please do not  hesitate to call me at [phone].  Sincerely,   Star Age, MD, PhD

## 2019-06-15 ENCOUNTER — Other Ambulatory Visit: Payer: Self-pay

## 2019-06-15 ENCOUNTER — Ambulatory Visit (INDEPENDENT_AMBULATORY_CARE_PROVIDER_SITE_OTHER): Payer: BC Managed Care – PPO | Admitting: Pharmacist Clinician (PhC)/ Clinical Pharmacy Specialist

## 2019-06-15 DIAGNOSIS — Z952 Presence of prosthetic heart valve: Secondary | ICD-10-CM

## 2019-06-15 DIAGNOSIS — I719 Aortic aneurysm of unspecified site, without rupture: Secondary | ICD-10-CM

## 2019-06-15 DIAGNOSIS — Z7901 Long term (current) use of anticoagulants: Secondary | ICD-10-CM | POA: Diagnosis not present

## 2019-06-15 DIAGNOSIS — I7121 Aneurysm of the ascending aorta, without rupture: Secondary | ICD-10-CM

## 2019-06-15 LAB — POCT INR: INR: 2.8 (ref 2.0–3.0)

## 2019-06-18 ENCOUNTER — Encounter: Payer: Self-pay | Admitting: Family Medicine

## 2019-06-27 ENCOUNTER — Encounter: Payer: Self-pay | Admitting: Emergency Medicine

## 2019-06-29 ENCOUNTER — Encounter: Payer: Self-pay | Admitting: Family Medicine

## 2019-07-23 ENCOUNTER — Encounter: Payer: Self-pay | Admitting: Neurology

## 2019-07-26 NOTE — Telephone Encounter (Signed)
Was able to pull download.  His last appt 05-22-19 you wanted to see download.  Will place in in box on Monday.

## 2019-07-27 ENCOUNTER — Ambulatory Visit (INDEPENDENT_AMBULATORY_CARE_PROVIDER_SITE_OTHER): Payer: BC Managed Care – PPO | Admitting: Pharmacist

## 2019-07-27 ENCOUNTER — Other Ambulatory Visit: Payer: Self-pay

## 2019-07-27 DIAGNOSIS — Z7901 Long term (current) use of anticoagulants: Secondary | ICD-10-CM | POA: Diagnosis not present

## 2019-07-27 DIAGNOSIS — Z952 Presence of prosthetic heart valve: Secondary | ICD-10-CM | POA: Diagnosis not present

## 2019-07-27 DIAGNOSIS — I7121 Aneurysm of the ascending aorta, without rupture: Secondary | ICD-10-CM

## 2019-07-27 DIAGNOSIS — I719 Aortic aneurysm of unspecified site, without rupture: Secondary | ICD-10-CM

## 2019-07-27 LAB — POCT INR: INR: 2.5 (ref 2.0–3.0)

## 2019-07-30 ENCOUNTER — Telehealth: Payer: Self-pay | Admitting: Neurology

## 2019-07-30 NOTE — Telephone Encounter (Signed)
I reviewed patient's AutoPap compliance data from 06/25/2019 through 07/24/2019, which is a total of 30 days, during which time he used his machine every night with percent use days greater than 4 hours at 100%, indicating superb compliance with an average usage of 7 hours and 54 minutes, residual AHI borderline at 5/h, mean pressure of 12.9 cm, 90th percentile of pressure at 16.4 cm with a range of 8 cm to 20 cm.  Leak is on the low side.  Please call patient back (he had emailed through My Chart) and advise him that his compliance data looks excellent.  As discussed, he can follow-up routinely in one year.  May send My Chart message back too.

## 2019-07-30 NOTE — Telephone Encounter (Signed)
Responded to pt via mychart

## 2019-09-03 ENCOUNTER — Encounter: Payer: Self-pay | Admitting: Family Medicine

## 2019-09-07 ENCOUNTER — Other Ambulatory Visit: Payer: Self-pay

## 2019-09-07 ENCOUNTER — Ambulatory Visit (INDEPENDENT_AMBULATORY_CARE_PROVIDER_SITE_OTHER): Payer: BC Managed Care – PPO | Admitting: Pharmacist

## 2019-09-07 DIAGNOSIS — Z952 Presence of prosthetic heart valve: Secondary | ICD-10-CM | POA: Diagnosis not present

## 2019-09-07 DIAGNOSIS — Z7901 Long term (current) use of anticoagulants: Secondary | ICD-10-CM

## 2019-09-07 LAB — POCT INR: INR: 3.1 — AB (ref 2.0–3.0)

## 2019-09-17 ENCOUNTER — Other Ambulatory Visit: Payer: Self-pay

## 2019-09-17 ENCOUNTER — Encounter: Payer: Self-pay | Admitting: Cardiology

## 2019-09-17 ENCOUNTER — Ambulatory Visit (INDEPENDENT_AMBULATORY_CARE_PROVIDER_SITE_OTHER): Payer: BC Managed Care – PPO | Admitting: Cardiology

## 2019-09-17 VITALS — BP 130/70 | HR 78 | Ht 66.0 in | Wt 195.0 lb

## 2019-09-17 DIAGNOSIS — Z9889 Other specified postprocedural states: Secondary | ICD-10-CM

## 2019-09-17 DIAGNOSIS — E785 Hyperlipidemia, unspecified: Secondary | ICD-10-CM | POA: Diagnosis not present

## 2019-09-17 DIAGNOSIS — Z8679 Personal history of other diseases of the circulatory system: Secondary | ICD-10-CM

## 2019-09-17 DIAGNOSIS — Z952 Presence of prosthetic heart valve: Secondary | ICD-10-CM | POA: Diagnosis not present

## 2019-09-17 HISTORY — DX: Personal history of other diseases of the circulatory system: Z86.79

## 2019-09-17 HISTORY — DX: Personal history of other diseases of the circulatory system: Z98.890

## 2019-09-17 NOTE — Patient Instructions (Signed)
Medication Instructions:  Your physician recommends that you continue on your current medications as directed. Please refer to the Current Medication list given to you today.  *If you need a refill on your cardiac medications before your next appointment, please call your pharmacy*  Lab Work: Come back to the lab on a day you can fast.  Nothing to eat or drink after midnight the night before.  We will draw a LIpid profile  If you have labs (blood work) drawn today and your tests are completely normal, you will receive your results only by: Marland Kitchen MyChart Message (if you have MyChart) OR . A paper copy in the mail If you have any lab test that is abnormal or we need to change your treatment, we will call you to review the results.  Testing/Procedures: None ordered  Follow-Up: At [facility], you and your health needs are our priority.  As part of our continuing mission to provide you with exceptional heart care, we have created designated Provider Care Teams.  These Care Teams include your primary Cardiologist (physician) and Advanced Practice Providers (APPs -  Physician Assistants and Nurse Practitioners) who all work together to provide you with the care you need, when you need it.  Your next appointment:   6 month(s)  The format for your next appointment:   In Person  Provider:   Jenne Campus, MD  Other Instructions

## 2019-09-17 NOTE — Progress Notes (Signed)
Cardiology Office Note:    Date:  09/17/2019   ID:  Larry Parks, DOB [birth year], MRN [id number]  PCP:  Luetta Nutting, DO  Cardiologist:  Jenne Campus, MD    Referring MD: Luetta Nutting, DO   Chief Complaint  Patient presents with  . Follow-up    6 Mo FU     History of Present Illness:    Larry Parks is a 44 y.o. male status post aortic valve replacement with On-X 23 valve, aortic aneurysm repair, dyslipidemia.  Comes today to my office for follow-up.  Overall doing very well.  He also has some depression but that successfully managed with Lexapro.  He is doing great is trying to walk on a regular basis have no difficulty doing it.  No chest pain tightness squeezing pressure burning chest no shortness of breath.  Past Medical History:  Diagnosis Date  . Hyperlipidemia     Past Surgical History:  Procedure Laterality Date  . aortic valve replaced      Current Medications: Current Meds  Medication Sig  . Cholecalciferol (VITAMIN D3) 1.25 MG (50000 UT) CAPS Take by mouth.  . co-enzyme Q-10 30 MG capsule Take 30 mg by mouth 3 (three) times daily.  Marland Kitchen ezetimibe (ZETIA) 10 MG tablet Take 1 tablet (10 mg total) by mouth daily.  Marland Kitchen warfarin (COUMADIN) 5 MG tablet Pt taking 7.5 mg Tuesday Thursday Saturday and Sunday 10 mg Mon Wed Friday     Allergies:   Patient has no known allergies.   Social History   Socioeconomic History  . Marital status: Married    Spouse name: Not on file  . Number of children: Not on file  . Years of education: Not on file  . Highest education level: Not on file  Occupational History  . Not on file  Tobacco Use  . Smoking status: Former Smoker    Quit date: 2004    Years since quitting: 17.0  . Smokeless tobacco: Never Used  Substance and Sexual Activity  . Alcohol use: Never  . Drug use: Never  . Sexual activity: Not on file  Other Topics Concern  . Not on file  Social History Narrative  . Not on file   Social Determinants  of Health   Financial Resource Strain:   . Difficulty of Paying Living Expenses: Not on file  Food Insecurity:   . Worried About Charity fundraiser in the Last Year: Not on file  . Ran Out of Food in the Last Year: Not on file  Transportation Needs:   . Lack of Transportation (Medical): Not on file  . Lack of Transportation (Non-Medical): Not on file  Physical Activity:   . Days of Exercise per Week: Not on file  . Minutes of Exercise per Session: Not on file  Stress:   . Feeling of Stress : Not on file  Social Connections:   . Frequency of Communication with Friends and Family: Not on file  . Frequency of Social Gatherings with Friends and Family: Not on file  . Attends Religious Services: Not on file  . Active Member of Clubs or Organizations: Not on file  . Attends Archivist Meetings: Not on file  . Marital Status: Not on file     Family History: The patient's family history includes Diabetes in his father; Heart attack in his father. ROS:   Please see the history of present illness.    All 14 point review of systems negative except as  described per history of present illness  EKGs/Labs/Other Studies Reviewed:      Recent Labs: 03/21/2019: BUN 11; Creatinine, Ser 0.75; Potassium 4.2; Sodium 137  Recent Lipid Panel    Component Value Date/Time   CHOL 203 (H) 03/21/2019 1637   TRIG 451 (H) 03/21/2019 1637   HDL 28 (L) 03/21/2019 1637   CHOLHDL 7.3 (H) 03/21/2019 1637   LDLCALC Comment 03/21/2019 1637    Physical Exam:    VS:  BP 130/70   Pulse 78   Ht 5\' 6"  (1.676 m)   Wt 195 lb (88.5 kg)   SpO2 99%   BMI 31.47 kg/m     Wt Readings from Last 3 Encounters:  09/17/19 195 lb (88.5 kg)  05/22/19 188 lb (85.3 kg)  03/21/19 180 lb 12.8 oz (82 kg)     GEN:  Well nourished, well developed in no acute distress HEENT: Normal NECK: No JVD; No carotid bruits LYMPHATICS: No lymphadenopathy CARDIAC: RRR, crisp mechanical valve sounds, soft systolic  murmur grade 1/6 best heard at the right upper portion of the sternum, no rubs, no gallops RESPIRATORY:  Clear to auscultation without rales, wheezing or rhonchi  ABDOMEN: Soft, non-tender, non-distended MUSCULOSKELETAL:  No edema; No deformity  SKIN: Warm and dry LOWER EXTREMITIES: no swelling NEUROLOGIC:  Alert and oriented x 3 PSYCHIATRIC:  Normal affect   ASSESSMENT:    1. Hyperlipidemia, unspecified hyperlipidemia type   2. Status post aortic valve replacement   3. Status post thoracic aortic aneurysm repair    PLAN:    In order of problems listed above:  1. Status post aortic valve replacement this is 23 mm On-X valve.  Last echocardiogram done December showed mean gradient of 8 peak of 14 mmHg.  This is acceptable parameter for his valve.  Valve sounds very good.  He is doing very well.  We discussed the issue of the valve I told him that for Onyx valve we can keep his INR between 1.5 and 2.5 , I also according to American guidelines he also need to be taking aspirin 81 mg daily, however, European guidelines do not recommend that.  We talked in length about this case does not want to take aspirin he preferred to continue just Coumadin.  This is something we can continue discussing.  We also discussed the need to antibiotic prophylaxis before any dental work. 2. Status post aortic valve replacement recent CT showed stable situation.  We will repeat another CT of his chest in the summer of this year. 3. Dyslipidemia we will check his fasting lipid profile   Medication Adjustments/Labs and Tests Ordered: Current medicines are reviewed at length with the patient today.  Concerns regarding medicines are outlined above.  Orders Placed This Encounter  Procedures  . Lipid panel   Medication changes: No orders of the defined types were placed in this encounter.   Signed, Park Liter, MD, [facility] 09/17/2019 1:44 PM    [place]

## 2019-09-24 ENCOUNTER — Other Ambulatory Visit: Payer: Self-pay | Admitting: *Deleted

## 2019-09-24 DIAGNOSIS — E785 Hyperlipidemia, unspecified: Secondary | ICD-10-CM

## 2019-09-25 LAB — LIPID PANEL
Chol/HDL Ratio: 7.7 ratio — ABNORMAL HIGH (ref 0.0–5.0)
Cholesterol, Total: 201 mg/dL — ABNORMAL HIGH (ref 100–199)
HDL: 26 mg/dL — ABNORMAL LOW (ref >39)
LDL Chol Calc (NIH): 115 mg/dL — ABNORMAL HIGH (ref 0–99)
Triglycerides: 344 mg/dL — ABNORMAL HIGH (ref 0–149)
VLDL Cholesterol Cal: 60 mg/dL — ABNORMAL HIGH (ref 5–40)

## 2019-09-26 ENCOUNTER — Telehealth: Payer: Self-pay | Admitting: Emergency Medicine

## 2019-09-26 DIAGNOSIS — E785 Hyperlipidemia, unspecified: Secondary | ICD-10-CM

## 2019-09-26 NOTE — Telephone Encounter (Signed)
Called patient informed him of lab results. He was advised to have fasting labs done in 3 months. He verbally understood. No further questions.

## 2019-10-15 ENCOUNTER — Telehealth: Payer: Self-pay

## 2019-10-15 NOTE — Telephone Encounter (Signed)
LMOMED THE PT ASKING TO CHANGE TO 145 INSTEAD

## 2019-10-19 ENCOUNTER — Other Ambulatory Visit: Payer: Self-pay

## 2019-10-19 ENCOUNTER — Ambulatory Visit (INDEPENDENT_AMBULATORY_CARE_PROVIDER_SITE_OTHER): Payer: BC Managed Care – PPO | Admitting: Pharmacist Clinician (PhC)/ Clinical Pharmacy Specialist

## 2019-10-19 DIAGNOSIS — Z7901 Long term (current) use of anticoagulants: Secondary | ICD-10-CM

## 2019-10-19 DIAGNOSIS — Z952 Presence of prosthetic heart valve: Secondary | ICD-10-CM | POA: Diagnosis not present

## 2019-10-19 LAB — POCT INR: INR: 2 (ref 2.0–3.0)

## 2019-10-19 NOTE — Patient Instructions (Signed)
Decrease dose to 7.5mg  daily except for 10mg  each Monday.  Repeat INR in 4 weeks Call clinic at [phone] for changes in medication or procedures

## 2019-10-22 ENCOUNTER — Encounter: Payer: Self-pay | Admitting: Family Medicine

## 2019-10-22 ENCOUNTER — Other Ambulatory Visit: Payer: Self-pay

## 2019-10-22 ENCOUNTER — Telehealth (INDEPENDENT_AMBULATORY_CARE_PROVIDER_SITE_OTHER): Payer: BC Managed Care – PPO | Admitting: Family Medicine

## 2019-10-22 ENCOUNTER — Other Ambulatory Visit (INDEPENDENT_AMBULATORY_CARE_PROVIDER_SITE_OTHER): Payer: BC Managed Care – PPO

## 2019-10-22 VITALS — Temp 96.9°F | Ht 66.0 in

## 2019-10-22 DIAGNOSIS — T783XXA Angioneurotic edema, initial encounter: Secondary | ICD-10-CM

## 2019-10-22 HISTORY — DX: Angioneurotic edema, initial encounter: T78.3XXA

## 2019-10-22 LAB — COMPREHENSIVE METABOLIC PANEL
ALT: 40 U/L (ref 0–53)
AST: 29 U/L (ref 0–37)
Albumin: 4.2 g/dL (ref 3.5–5.2)
Alkaline Phosphatase: 77 U/L (ref 39–117)
BUN: 14 mg/dL (ref 6–23)
CO2: 27 mEq/L (ref 19–32)
Calcium: 9.3 mg/dL (ref 8.4–10.5)
Chloride: 103 mEq/L (ref 96–112)
Creatinine, Ser: 1 mg/dL (ref 0.40–1.50)
GFR: 80.39 mL/min (ref >60.00)
Glucose, Bld: 131 mg/dL — ABNORMAL HIGH (ref 70–99)
Potassium: 3.9 mEq/L (ref 3.5–5.1)
Sodium: 139 mEq/L (ref 135–145)
Total Bilirubin: 0.4 mg/dL (ref 0.2–1.2)
Total Protein: 7 g/dL (ref 6.0–8.3)

## 2019-10-22 LAB — CBC
HCT: 44 % (ref 39.0–52.0)
Hemoglobin: 14.7 g/dL (ref 13.0–17.0)
MCHC: 33.4 g/dL (ref 30.0–36.0)
MCV: 83.2 fl (ref 78.0–100.0)
Platelets: 220 10*3/uL (ref 150.0–400.0)
RBC: 5.28 Mil/uL (ref 4.22–5.81)
RDW: 13.5 % (ref 11.5–15.5)
WBC: 7.8 10*3/uL (ref 4.0–10.5)

## 2019-10-22 LAB — SEDIMENTATION RATE: Sed Rate: 32 mm/hr — ABNORMAL HIGH (ref 0–15)

## 2019-10-22 MED ORDER — CETIRIZINE HCL 10 MG PO TABS: 10.0000 mg | ORAL_TABLET | Freq: Every day | ORAL | 11 refills | Status: DC

## 2019-10-22 MED ORDER — PREDNISONE 10 MG PO TABS: 10.0000 mg | ORAL_TABLET | Freq: Two times a day (BID) | ORAL | 0 refills | Status: AC

## 2019-10-22 NOTE — Progress Notes (Signed)
Established Patient Office Visit  Subjective:  Patient ID: Larry Parks, male    DOB: 01/29/1976  Age: 44 y.o. MRN: [id number]  CC:  Chief Complaint  Patient presents with  . Rash    c/o red bumpy rash right thigh, lips, hands and torso area patient notice yesterday very itchy.     HPI Larry Parks presents for evaluation and treatment of a 1 day history of swelling in his lips, migratory rash that is mildly pruritic on his hands and right waist area.  Patient denies tightness in his throat or difficulty breathing.  Denies any recent illness cough or flu.  No new medications.  Last night he had little to and fried fish.  Received his second Covid vaccine 1 week ago.  Past Medical History:  Diagnosis Date  . Hyperlipidemia     Past Surgical History:  Procedure Laterality Date  . aortic valve replaced      Family History  Problem Relation Age of Onset  . Diabetes Father   . Heart attack Father     Social History   Socioeconomic History  . Marital status: Married    Spouse name: Not on file  . Number of children: Not on file  . Years of education: Not on file  . Highest education level: Not on file  Occupational History  . Not on file  Tobacco Use  . Smoking status: Former Smoker    Quit date: 2004    Years since quitting: 17.1  . Smokeless tobacco: Never Used  Substance and Sexual Activity  . Alcohol use: Never  . Drug use: Never  . Sexual activity: Not on file  Other Topics Concern  . Not on file  Social History Narrative  . Not on file   Social Determinants of Health   Financial Resource Strain:   . Difficulty of Paying Living Expenses: Not on file  Food Insecurity:   . Worried About Charity fundraiser in the Last Year: Not on file  . Ran Out of Food in the Last Year: Not on file  Transportation Needs:   . Lack of Transportation (Medical): Not on file  . Lack of Transportation (Non-Medical): Not on file  Physical Activity:   . Days of Exercise  per Week: Not on file  . Minutes of Exercise per Session: Not on file  Stress:   . Feeling of Stress : Not on file  Social Connections:   . Frequency of Communication with Friends and Family: Not on file  . Frequency of Social Gatherings with Friends and Family: Not on file  . Attends Religious Services: Not on file  . Active Member of Clubs or Organizations: Not on file  . Attends Archivist Meetings: Not on file  . Marital Status: Not on file  Intimate Partner Violence:   . Fear of Current or Ex-Partner: Not on file  . Emotionally Abused: Not on file  . Physically Abused: Not on file  . Sexually Abused: Not on file    Outpatient Medications Prior to Visit  Medication Sig Dispense Refill  . Cholecalciferol (VITAMIN D3) 1.25 MG (50000 UT) CAPS Take by mouth.    . co-enzyme Q-10 30 MG capsule Take 30 mg by mouth 3 (three) times daily.    Marland Kitchen ezetimibe (ZETIA) 10 MG tablet Take 1 tablet (10 mg total) by mouth daily. 90 tablet 1  . warfarin (COUMADIN) 5 MG tablet Pt taking 7.5 mg Tuesday Thursday Saturday and Sunday 10 mg  Mon Wed Friday     No facility-administered medications prior to visit.    No Known Allergies  ROS Review of Systems  Constitutional: Negative.  Negative for diaphoresis, fatigue, fever and unexpected weight change.  HENT: Negative.   Eyes: Negative for photophobia and visual disturbance.  Respiratory: Negative for cough, choking, chest tightness, shortness of breath and wheezing.   Cardiovascular: Negative.   Gastrointestinal: Negative.  Negative for abdominal pain, diarrhea, nausea and vomiting.  Endocrine: Negative for polyphagia and polyuria.  Genitourinary: Negative.   Musculoskeletal: Negative for arthralgias and myalgias.  Skin: Positive for rash.  Allergic/Immunologic: Negative for immunocompromised state.  Neurological: Negative for light-headedness and headaches.  Hematological: Does not bruise/bleed easily.  Psychiatric/Behavioral:  Negative.       Objective:    Physical Exam  Constitutional: He is oriented to person, place, and time. He appears well-developed and well-nourished. No distress.  HENT:  Head: Normocephalic and atraumatic.  Right Ear: External ear normal.  Left Ear: External ear normal.  Mouth/Throat:    Eyes: Conjunctivae are normal. Right eye exhibits no discharge. Left eye exhibits no discharge. No scleral icterus.  Neck: No JVD present. No tracheal deviation present.  Pulmonary/Chest: Effort normal. No stridor.  Neurological: He is alert and oriented to person, place, and time.  Skin: Skin is warm and dry. He is not diaphoretic.     Psychiatric: He has a normal mood and affect. His behavior is normal.    Temp (!) 96.9 F (36.1 C) (Oral)   Ht 5\' 6"  (1.676 m)   BMI 31.47 kg/m  Wt Readings from Last 3 Encounters:  09/17/19 195 lb (88.5 kg)  05/22/19 188 lb (85.3 kg)  03/21/19 180 lb 12.8 oz (82 kg)     Health Maintenance Due  Topic Date Due  . HIV Screening  08/28/1988    There are no preventive care reminders to display for this patient.  No results found for: TSH No results found for: WBC, HGB, HCT, MCV, PLT Lab Results  Component Value Date   NA 137 03/21/2019   K 4.2 03/21/2019   CO2 24 03/21/2019   GLUCOSE 81 03/21/2019   BUN 11 03/21/2019   CREATININE 0.75 (L) 03/21/2019   CALCIUM 9.2 03/21/2019   Lab Results  Component Value Date   CHOL 201 (H) 09/24/2019   Lab Results  Component Value Date   HDL 26 (L) 09/24/2019   Lab Results  Component Value Date   LDLCALC 115 (H) 09/24/2019   Lab Results  Component Value Date   TRIG 344 (H) 09/24/2019   Lab Results  Component Value Date   CHOLHDL 7.7 (H) 09/24/2019   No results found for: HGBA1C    Assessment & Plan:   Problem List Items Addressed This Visit      Other   Angio-edema - Primary   Relevant Medications   cetirizine (ZYRTEC) 10 MG tablet   predniSONE (DELTASONE) 10 MG tablet   Other  Relevant Orders   C3 and C4   Sedimentation rate   CBC   Comprehensive metabolic panel   Ambulatory referral to Allergy      Meds ordered this encounter  Medications  . cetirizine (ZYRTEC) 10 MG tablet    Sig: Take 1 tablet (10 mg total) by mouth at bedtime.    Dispense:  30 tablet    Refill:  11  . predniSONE (DELTASONE) 10 MG tablet    Sig: Take 1 tablet (10 mg total) by mouth 2 (  two) times daily with a meal for 5 days.    Dispense:  10 tablet    Refill:  0    Follow-up: No follow-ups on file.   Reviewed patient's medication list for possible causes.  Did not see where any of them are known to be associated with angioedema.  Interaction noted between coenzyme Q 10 and warfarin.  Patient was advised to stop coenzyme Q 10.  He will go immediately to an emergency room or call 911 with any tightness in his throat or difficulty breathing.  Return for above ordered blood work and have asked for allergist consultation. Libby Maw, MD   Virtual Visit via Video Note  I connected with Larry Parks on 10/22/19 at  9:30 AM EST by a video enabled telemedicine application and verified that I am speaking with the correct person using two identifiers.  Location: Patient: home alone in his room.  Provider:   I discussed the limitations of evaluation and management by telemedicine and the availability of in person appointments. The patient expressed understanding and agreed to proceed.  History of Present Illness:    Observations/Objective:   Assessment and Plan:   Follow Up Instructions:    I discussed the assessment and treatment plan with the patient. The patient was provided an opportunity to ask questions and all were answered. The patient agreed with the plan and demonstrated an understanding of the instructions.   The patient was advised to call back or seek an in-person evaluation if the symptoms worsen or if the condition fails to improve as anticipated.  I  provided 25 minutes of non-face-to-face time during this encounter.   Libby Maw, MD

## 2019-10-23 LAB — C3 AND C4
C3 Complement: 151 mg/dL (ref 82–185)
C4 Complement: 41 mg/dL (ref 15–53)

## 2019-11-01 ENCOUNTER — Other Ambulatory Visit: Payer: Self-pay

## 2019-11-02 ENCOUNTER — Encounter: Payer: Self-pay | Admitting: Family Medicine

## 2019-11-02 ENCOUNTER — Ambulatory Visit (INDEPENDENT_AMBULATORY_CARE_PROVIDER_SITE_OTHER): Payer: BC Managed Care – PPO | Admitting: Family Medicine

## 2019-11-02 VITALS — BP 110/70 | HR 89 | Temp 97.0°F | Ht 66.0 in | Wt 188.4 lb

## 2019-11-02 DIAGNOSIS — Z1211 Encounter for screening for malignant neoplasm of colon: Secondary | ICD-10-CM

## 2019-11-02 DIAGNOSIS — Z952 Presence of prosthetic heart valve: Secondary | ICD-10-CM

## 2019-11-02 DIAGNOSIS — Z8679 Personal history of other diseases of the circulatory system: Secondary | ICD-10-CM

## 2019-11-02 DIAGNOSIS — Z9889 Other specified postprocedural states: Secondary | ICD-10-CM

## 2019-11-02 DIAGNOSIS — E785 Hyperlipidemia, unspecified: Secondary | ICD-10-CM | POA: Diagnosis not present

## 2019-11-02 NOTE — Patient Instructions (Signed)
[facility] [address], [place], [place] [address] Phone: [phone]  [facility] [address], [place],  [address] Phone: [phone]  

## 2019-11-02 NOTE — Progress Notes (Signed)
Larry Parks is a 44 y.o. male  Chief Complaint  Patient presents with  . Establish Care    Pt has no complaints today. Pt had his Covid shots, last dose 10/16/2019.    HPI: Larry Parks is a 44 y.o. male seen today for TOC. Previous PCP was Dr. Zigmund Daniel. Pt has a h/o hyperlipidemia and is s/p aortic valve repair and thoracic aortic aneurysm repair. He is on zetia 10mg  daily and coumadin 5mg  daily. He has his INR checked every 4-6 wks. Goal is 1.5-2.5 (previously 2.0-3.0). FLP in 02/2019, CMP and CBC in 10/2019. He has upcoming appt with allergy Dr. Maudie Mercury for recent allergic reaction - lip swelling, rash. Symptoms resolved at this time.   Past Medical History:  Diagnosis Date  . Hyperlipidemia     Past Surgical History:  Procedure Laterality Date  . aortic valve replaced      Social History   Socioeconomic History  . Marital status: Married    Spouse name: Not on file  . Number of children: Not on file  . Years of education: Not on file  . Highest education level: Not on file  Occupational History  . Not on file  Tobacco Use  . Smoking status: Former Smoker    Quit date: 2004    Years since quitting: 17.2  . Smokeless tobacco: Never Used  Substance and Sexual Activity  . Alcohol use: Never  . Drug use: Never  . Sexual activity: Not on file  Other Topics Concern  . Not on file  Social History Narrative  . Not on file   Social Determinants of Health   Financial Resource Strain:   . Difficulty of Paying Living Expenses:   Food Insecurity:   . Worried About Charity fundraiser in the Last Year:   . Arboriculturist in the Last Year:   Transportation Needs:   . Film/video editor (Medical):   Marland Kitchen Lack of Transportation (Non-Medical):   Physical Activity:   . Days of Exercise per Week:   . Minutes of Exercise per Session:   Stress:   . Feeling of Stress :   Social Connections:   . Frequency of Communication with Friends and Family:   . Frequency of  Social Gatherings with Friends and Family:   . Attends Religious Services:   . Active Member of Clubs or Organizations:   . Attends Archivist Meetings:   Marland Kitchen Marital Status:   Intimate Partner Violence:   . Fear of Current or Ex-Partner:   . Emotionally Abused:   Marland Kitchen Physically Abused:   . Sexually Abused:     Family History  Problem Relation Age of Onset  . Diabetes Father   . Heart attack Father      Immunization History  Administered Date(s) Administered  . Influenza,inj,Quad PF,6+ Mos 10/19/2018, 05/03/2019  . Td 01/31/2017  . Tdap 12/27/2013    Outpatient Encounter Medications as of 11/02/2019  Medication Sig  . cetirizine (ZYRTEC) 10 MG tablet Take 1 tablet (10 mg total) by mouth at bedtime.  . Cholecalciferol (VITAMIN D3) 1.25 MG (50000 UT) CAPS Take by mouth.  . co-enzyme Q-10 30 MG capsule Take 30 mg by mouth 3 (three) times daily.  Marland Kitchen ezetimibe (ZETIA) 10 MG tablet Take 1 tablet (10 mg total) by mouth daily.  Marland Kitchen warfarin (COUMADIN) 5 MG tablet Pt taking 7.5 mg Tuesday Thursday Saturday and Sunday 10 mg Mon Wed Friday   No facility-administered encounter medications  on file as of 11/02/2019.     ROS: Gen: no fever, chills  Skin: no rash, itching Eyes: no blurry vision, double vision Resp: no cough, wheeze,SOB CV: no CP, palpitations, LE edema,  GI: no heartburn, n/v/d/c, abd pain MSK: no joint pain, myalgias, back pain Neuro: no dizziness, headache, weakness  Psych: no depression, anxiety, insomnia   No Known Allergies  BP 110/70 (BP Location: Left Arm, Patient Position: Sitting, Cuff Size: Normal)   Pulse 89   Temp (!) 97 F (36.1 C) (Temporal)   Ht 5\' 6"  (1.676 m)   Wt 188 lb 6.4 oz (85.5 kg)   SpO2 97%   BMI 30.41 kg/m    BP Readings from Last 3 Encounters:  11/02/19 110/70  09/17/19 130/70  05/22/19 120/75   Wt Readings from Last 3 Encounters:  11/02/19 188 lb 6.4 oz (85.5 kg)  09/17/19 195 lb (88.5 kg)  05/22/19 188 lb (85.3 kg)    Pulse Readings from Last 3 Encounters:  11/02/19 89  09/17/19 78  05/22/19 73    Physical Exam  Constitutional: He is oriented to person, place, and time. He appears well-developed and well-nourished.  Pulmonary/Chest: No respiratory distress.  Musculoskeletal:        General: No edema.     Cervical back: Neck supple.  Neurological: He is alert and oriented to person, place, and time.  Psychiatric: He has a normal mood and affect. His behavior is normal.     A/P:  1. Screening for colon cancer - Ambulatory referral to Gastroenterology  2. Status post thoracic aortic aneurysm repair - follows with cardio - cont coumadin  3. Status post aortic valve replacement - follows with cardio - cont with coumadin  4. Dyslipidemia - cont statin - due in 02/2019   This visit occurred during the SARS-CoV-2 public health emergency.  Safety protocols were in place, including screening questions prior to the visit, additional usage of staff PPE, and extensive cleaning of exam room while observing appropriate contact time as indicated for disinfecting solutions.

## 2019-11-14 ENCOUNTER — Ambulatory Visit: Payer: BC Managed Care – PPO | Admitting: Allergy

## 2019-11-14 ENCOUNTER — Encounter: Payer: Self-pay | Admitting: Allergy

## 2019-11-14 ENCOUNTER — Other Ambulatory Visit: Payer: Self-pay

## 2019-11-14 VITALS — BP 110/82 | HR 60 | Temp 97.2°F | Resp 16 | Ht 66.0 in | Wt 189.6 lb

## 2019-11-14 DIAGNOSIS — J3089 Other allergic rhinitis: Secondary | ICD-10-CM

## 2019-11-14 DIAGNOSIS — T783XXD Angioneurotic edema, subsequent encounter: Secondary | ICD-10-CM

## 2019-11-14 DIAGNOSIS — L282 Other prurigo: Secondary | ICD-10-CM

## 2019-11-14 HISTORY — DX: Other allergic rhinitis: J30.89

## 2019-11-14 HISTORY — DX: Other prurigo: L28.2

## 2019-11-14 NOTE — Progress Notes (Signed)
New Patient Note  RE: Larry Parks MRN: [id number] DOB: [birth year] Date of Office Visit: 11/14/2019  Referring provider: Libby Parks,* Primary care provider: Ronnald Nian, DO  Chief Complaint: Rash  History of Present Illness: I had the pleasure of seeing Larry Parks for initial evaluation at the [facility] on 11/14/2019. He is a 44 y.o. male, who is referred here by Larry Nian, DO for the evaluation of rash and swelling.  At night patient felt itchy on his legs, arms and buttocks area on 10/21/19.  The following day patient noted upper lip swelling. No issues with breathing.  He had televisit and was treated with prednisone for 5 days which resolved the symptoms without any recurrence.   Patient had no new foods for dinner.  Describes them as itchy and raised. Individual rashes lasts about 24 hours. No ecchymosis upon resolution. Associated symptoms include: mild lip swelling. Suspected triggers are unsure but that day he was shopping and wearing gloves. He was outdoors quite some time. Denies any fevers, chills, changes in medications, foods, personal care products or recent infections. He has tried the following therapies: prednisone with good benefit. Systemic steroids yes. Currently on no antihistamines.  Previous work up includes: 10/22/19 cbc, cmp, esr, c3 & c4 was normal. Previous history of rash/hives: possibly in the remote past but not sure.  Patient received his second moderna vaccine on 10/16/19 with no issues.  No covid-19 diagnosis.   10/22/2019 OV HPI: "Larry Parks presents for evaluation and treatment of a 1 day history of swelling in his lips, migratory rash that is mildly pruritic on his hands and right waist area.  Patient denies tightness in his throat or difficulty breathing.  Denies any recent illness cough or flu.  No new medications.  Last night he had little to and fried fish.  Received his second Covid vaccine 1  week ago."  Assessment and Plan: Larry Parks is a 44 y.o. male with: Pruritic rash Pruritic rash with mild lip angioedema in February. Denies any changes in diet, medications, personal care products, infection or insect bites. He did get the second Moderna vaccine 1 week prior. He was also shopping outdoors the day of the incident. Treated with prednisone and no reoccurrence. CBC, CMP, ESR, C3 and C4 was normal on 10/22/19. Patient concerned for allergies.  Today's skin testing showed: Positive to grass pollen, tree pollen, mold, dust mites, cat. Negative to common foods.   Discussed with patient that given above clinical history and test results there is a possibility that the environmental allergies especially the tree pollen may have contributed to his symptoms. However, can't rule out some delayed immune reaction from the covid vaccine either - less likely as he had no other symptoms.   Discussed environmental control measures.  May use over the counter antihistamines such as Zyrtec (cetirizine), Claritin (loratadine), Allegra (fexofenadine), or Xyzal (levocetirizine) daily as needed.  If you have another breakout of hives or swelling, start taking zyrtec 1m twice a day, take pictures and let uKoreaknow. May need additional work up at that time.   Angioedema of lips  See assessment and plan as above.   Other allergic rhinitis Rhino conjunctivitis symptoms during the spring months. Usually takes benadryl prn with good benefit. No previous allergy testing.  Today's skin testing showed: Positive to grass pollen, tree pollen, mold, dust mites, cat.  Start environmental control measures.  May use over the counter antihistamines such as Zyrtec (cetirizine),  Claritin (loratadine), Allegra (fexofenadine), or Xyzal (levocetirizine) daily as needed.  Return in about 6 months (around 05/16/2020).  Other allergy screening: Asthma: no Rhino conjunctivitis: yes  Rhino conjunctivitis symptoms during  the spring months. Usually takes benadryl as needed with good benefit. No previous allergy testing.   Food allergy: no  Dietary History: patient has been eating other foods including milk, eggs, peanut, treenuts, sesame, shellfish, seafood, soy, wheat, meats, fruits and vegetables.  Medication allergy: no Hymenoptera allergy: no Eczema:no History of recurrent infections suggestive of immunodeficency: no  Diagnostics: Skin Testing: Environmental allergy panel and select foods. Positive to grass pollen, tree pollen, mold, dust mites, cat. Negative to common foods.   Results discussed with patient/family. Airborne Adult Perc - 11/14/19 1425    Time Antigen Placed  1425    Allergen Manufacturer  Greer    Location  Back    Number of Test  59    Panel 1  Select    1. Control-Buffer 50% Glycerol  Negative    2. Control-Histamine 1 mg/ml  2+    3. Albumin saline  Negative    4. Duchesne  Negative    5. Guatemala  Negative    6. Johnson  Negative    7. Kentucky Blue  2+    8. Meadow Fescue  2+    9. Perennial Rye  Negative    10. Sweet Vernal  Negative    11. Timothy  2+    12. Cocklebur  Negative    13. Burweed Marshelder  Negative    14. Ragweed, short  Negative    15. Ragweed, Giant  Negative    16. Plantain,  English  Negative    17. Lamb's Quarters  Negative    18. Sheep Sorrell  Negative    19. Rough Pigweed  Negative    20. Marsh Elder, Rough  Negative    21. Mugwort, Common  Negative    22. Ash mix  Negative    23. Birch mix  3+    24. Beech American  Negative    25. Box, Elder  Negative    26. Cedar, red  Negative    27. Cottonwood, [place]  Negative    28. Elm mix  Negative    29. Hickory mix  Negative    30. Maple mix  Negative    31. Oak, [place] mix  3+    32. Pecan Pollen  2+    33. Pine mix  2+    34. Sycamore Eastern  Negative    35. Loveland, Black Pollen  Negative    36. Alternaria alternata  Negative    37. Cladosporium Herbarum  Negative    38.  Aspergillus mix  Negative    39. Penicillium mix  Negative    40. Bipolaris sorokiniana (Helminthosporium)  2+    41. Drechslera spicifera (Curvularia)  Negative    42. Mucor plumbeus  Negative    43. Fusarium moniliforme  2+    44. Aureobasidium pullulans (pullulara)  Negative    45. Rhizopus oryzae  Negative    46. Botrytis cinera  Negative    47. Epicoccum nigrum  Negative    48. Phoma betae  Negative    49. Candida Albicans  Negative    50. Trichophyton mentagrophytes  Negative    51. Mite, D Farinae  5,000 AU/ml  4+    52. Mite, D Pteronyssinus  5,000 AU/ml  4+    53. Cat Hair 10,000  BAU/ml  2+    54.  Dog Epithelia  Negative    55. Mixed Feathers  Negative    56. Horse Epithelia  Negative    57. Cockroach, German  Negative    58. Mouse  Negative    59. Tobacco Leaf  Negative     Food Perc - 11/14/19 1426    Time Antigen Placed  1426    Allergen Manufacturer  Lavella Hammock    Location  Back    Number of allergen test  10    1. Peanut  Negative    2. Soybean food  Negative    3. Wheat, whole  Negative    4. Sesame  Negative    5. Milk, cow  Negative    6. Egg White, chicken  Negative    7. Casein  Negative    8. Shellfish mix  Negative    9. Fish mix  Negative    10. Cashew  Negative     Intradermal - 11/14/19 1504    Time Antigen Placed  1504    Allergen Manufacturer  Lavella Hammock    Location  Arm    Number of Test  9    Intradermal  Select    Control  Negative    Guatem[place]  Negative    Johnson  Negative    Ragweed mix  Negative    Weed mix  Negative    Mold 1  Negative    Mold 2  Negative    Dog  Negative    Cockroach  Negative       Past Medical History: Patient Active Problem List   Diagnosis Date Noted  . Other allergic rhinitis 11/14/2019  . Pruritic rash 11/14/2019  . Angioedema of lips 10/22/2019  . 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/2019  . Status post aortic valve replacement 03/12/2019  . Dyslipidemia 03/12/2019   Past Medical History:    Diagnosis Date  . Hyperlipidemia    Past Surgical History: Past Surgical History:  Procedure Laterality Date  . aortic valve replaced     Medication List:  Current Outpatient Medications  Medication Sig Dispense Refill  . Cholecalciferol (VITAMIN D3) 1.25 MG (50000 UT) CAPS Take by mouth.    . co-enzyme Q-10 30 MG capsule Take 30 mg by mouth 3 (three) times daily.    Marland Kitchen ezetimibe (ZETIA) 10 MG tablet Take 1 tablet (10 mg total) by mouth daily. 90 tablet 1  . warfarin (COUMADIN) 5 MG tablet Pt taking 7.5 mg Tuesday Thursday Saturday and Sunday 10 mg Mon Wed Friday     No current facility-administered medications for this visit.   Allergies: No Known Allergies Social History: Social History   Socioeconomic History  . Marital status: Married    Spouse name: Not on file  . Number of children: Not on file  . Years of education: Not on file  . Highest education level: Not on file  Occupational History  . Not on file  Tobacco Use  . Smoking status: Former Smoker    Quit date: 2004    Years since quitting: 17.2  . Smokeless tobacco: Never Used  Substance and Sexual Activity  . Alcohol use: Never  . Drug use: Never  . Sexual activity: Not on file  Other Topics Concern  . Not on file  Social History Narrative  . Not on file   Social Determinants of Health   Financial Resource Strain:   . Difficulty of Paying Living Expenses:  Food Insecurity:   . Worried About Charity fundraiser in the Last Year:   . Arboriculturist in the Last Year:   Transportation Needs:   . Film/video editor (Medical):   Marland Kitchen Lack of Transportation (Non-Medical):   Physical Activity:   . Days of Exercise per Week:   . Minutes of Exercise per Session:   Stress:   . Feeling of Stress :   Social Connections:   . Frequency of Communication with Friends and Family:   . Frequency of Social Gatherings with Friends and Family:   . Attends Religious Services:   . Active Member of Clubs or  Organizations:   . Attends Archivist Meetings:   Marland Kitchen Marital Status:    Lives in a 49+ year old home. Smoking: denies Occupation: associate professor  Environmental HistoryFreight forwarder in the house: no Charity fundraiser in the family room: no Carpet in the bedroom: yes Heating: gas Cooling: central Pet: no  Family History: Family History  Problem Relation Age of Onset  . Diabetes Father   . Heart attack Father    Problem                               Relation Asthma                                   Sister as a child Eczema                                No  Food allergy                          No  Allergic rhino conjunctivitis     No   Review of Systems  Constitutional: Negative for appetite change, chills, fever and unexpected weight change.  HENT: Negative for congestion and rhinorrhea.   Eyes: Negative for itching.  Respiratory: Negative for cough, chest tightness, shortness of breath and wheezing.   Cardiovascular: Negative for chest pain.  Gastrointestinal: Negative for abdominal pain.  Genitourinary: Negative for difficulty urinating.  Skin: Negative for rash.  Allergic/Immunologic: Negative for environmental allergies and food allergies.  Neurological: Negative for headaches.   Objective: BP 110/82   Pulse 60   Temp (!) 97.2 F (36.2 C) (Temporal)   Resp 16   Ht '5\' 6"'  (1.676 m)   Wt 189 lb 9.6 oz (86 kg)   SpO2 99%   BMI 30.60 kg/m  Body mass index is 30.6 kg/m. Physical Exam  Constitutional: He is oriented to person, place, and time. He appears well-developed and well-nourished.  HENT:  Head: Normocephalic and atraumatic.  Right Ear: External ear normal.  Left Ear: External ear normal.  Nose: Nose normal.  Mouth/Throat: Oropharynx is clear and moist.  Eyes: Conjunctivae and EOM are normal.  Cardiovascular: Normal rate, regular rhythm and normal heart sounds. Exam reveals no gallop and no friction rub.  No murmur heard. Click from  prosthetic heart valve  Pulmonary/Chest: Effort normal and breath sounds normal. He has no wheezes. He has no rales.  Abdominal: Soft.  Musculoskeletal:     Cervical back: Neck supple.  Neurological: He is alert and oriented to person, place, and time.  Skin: Skin is warm. No rash noted.  Psychiatric: He has a normal mood and affect. His behavior is normal.  Nursing note and vitals reviewed.  The plan was reviewed with the patient/family, and all questions/concerned were addressed.  It was my pleasure to see Parks today and participate in his care. Please feel free to contact me with any questions or concerns.  Sincerely,  Rexene Alberts, DO Allergy & Immunology  [facility] office: [phone] [facility] office: [place] office: [phone]

## 2019-11-14 NOTE — Patient Instructions (Addendum)
Today's skin testing showed: Positive to grass pollen, tree pollen, mold, dust mites, cat. Negative to common foods.   Concern if the environmental allergies contributed to your symptoms.   Start environmental control measures.  May use over the counter antihistamines such as Zyrtec (cetirizine), Claritin (loratadine), Allegra (fexofenadine), or Xyzal (levocetirizine) daily as needed.   If you have another breakout of hives or swelling, start taking zyrtec 10mg  twice a day.  Take pictures and let us know.  Follow up in 6 months or sooner if needed.  Reducing Pollen Exposure . Pollen seasons: trees (spring), grass (summer) and ragweed/weeds (fall). Marland Kitchen Keep windows closed in your home and car to lower pollen exposure.  Susa Simmonds air conditioning in the bedroom and throughout the house if possible.  . Avoid going out in dry windy days - especially early morning. . Pollen counts are highest between 5 - 10 AM and on dry, hot and windy days.  . Save outside activities for late afternoon or after a heavy rain, when pollen levels are lower.  . Avoid mowing of grass if you have grass pollen allergy. Marland Kitchen Be aware that pollen can also be transported indoors on people and pets.  . Dry your clothes in an automatic dryer rather than hanging them outside where they might collect pollen.  . Rinse hair and eyes before bedtime. Control of House Dust Mite Allergen . Dust mite allergens are a common trigger of allergy and asthma symptoms. While they can be found throughout the house, these microscopic creatures thrive in warm, humid environments such as bedding, upholstered furniture and carpeting. . Because so much time is spent in the bedroom, it is essential to reduce mite levels there.  . Encase pillows, mattresses, and box springs in special allergen-proof fabric covers or airtight, zippered plastic covers.  . Bedding should be washed weekly in hot water (130 F) and dried in a hot dryer.  Allergen-proof covers are available for comforters and pillows that can't be regularly washed.  Wendee Copp the allergy-proof covers every few months. Minimize clutter in the bedroom. Keep pets out of the bedroom.  Marland Kitchen Keep humidity less than 50% by using a dehumidifier or air conditioning. You can buy a humidity measuring device called a hygrometer to monitor this.  . If possible, replace carpets with hardwood, linoleum, or washable area rugs. If that's not possible, vacuum frequently with a vacuum that has a HEPA filter. . Remove all upholstered furniture and non-washable window drapes from the bedroom. . Remove all non-washable stuffed toys from the bedroom.  Wash stuffed toys weekly. Pet Allergen Avoidance: . Contrary to popular opinion, there are no "hypoallergenic" breeds of dogs or cats. That is because people are not allergic to an animal's hair, but to an allergen found in the animal's saliva, dander (dead skin flakes) or urine. Pet allergy symptoms typically occur within minutes. For some people, symptoms can build up and become most severe 8 to 12 hours after contact with the animal. People with severe allergies can experience reactions in public places if dander has been transported on the pet owners' clothing. Marland Kitchen Keeping an animal outdoors is only a partial solution, since homes with pets in the yard still have higher concentrations of animal allergens. . Before getting a pet, ask your allergist to determine if you are allergic to animals. If your pet is already considered part of your family, try to minimize contact and keep the pet out of the bedroom and other rooms where you  spend a great deal of time. . As with dust mites, vacuum carpets often or replace carpet with a hardwood floor, tile or linoleum. . High-efficiency particulate air (HEPA) cleaners can reduce allergen levels over time. . While dander and saliva are the source of cat and dog allergens, urine is the source of allergens from  rabbits, hamsters, mice and [place] pigs; so ask a non-allergic family member to clean the animal's cage. . If you have a pet allergy, talk to your allergist about the potential for allergy immunotherapy (allergy shots). This strategy can often provide long-term relief. Mold Control . Mold and fungi can grow on a variety of surfaces provided certain temperature and moisture conditions exist.  . Outdoor molds grow on plants, decaying vegetation and soil. The major outdoor mold, Alternaria and Cladosporium, are found in very high numbers during hot and dry conditions. Generally, a late summer - fall peak is seen for common outdoor fungal spores. Rain will temporarily lower outdoor mold spore count, but counts rise rapidly when the rainy period ends. . The most important indoor molds are Aspergillus and Penicillium. Dark, humid and poorly ventilated basements are ideal sites for mold growth. The next most common sites of mold growth are the bathroom and the kitchen. Outdoor (Seasonal) Mold Control . Use air conditioning and keep windows closed. . Avoid exposure to decaying vegetation. Marland Kitchen Avoid leaf raking. . Avoid grain handling. . Consider wearing a face mask if working in moldy areas.  Indoor (Perennial) Mold Control  . Maintain humidity below 50%. . Get rid of mold growth on hard surfaces with water, detergent and, if necessary, 5% bleach (do not mix with other cleaners). Then dry the area completely. If mold covers an area more than 10 square feet, consider hiring an indoor environmental professional. . For clothing, washing with soap and water is best. If moldy items cannot be cleaned and dried, throw them away. . Remove sources e.g. contaminated carpets. . Repair and seal leaking roofs or pipes. Using dehumidifiers in damp basements may be helpful, but empty the water and clean units regularly to prevent mildew from forming. All rooms, especially basements, bathrooms and kitchens, require  ventilation and cleaning to deter mold and mildew growth. Avoid carpeting on concrete or damp floors, and storing items in damp areas.

## 2019-11-14 NOTE — Assessment & Plan Note (Signed)
.   See assessment and plan as above. 

## 2019-11-14 NOTE — Assessment & Plan Note (Addendum)
Pruritic rash with mild lip angioedema in February. Denies any changes in diet, medications, personal care products, infection or insect bites. He did get the second Moderna vaccine 1 week prior. He was also shopping outdoors the day of the incident. Treated with prednisone and no reoccurrence. CBC, CMP, ESR, C3 and C4 was normal on 10/22/19. Patient concerned for allergies.  Today's skin testing showed: Positive to grass pollen, tree pollen, mold, dust mites, cat. Negative to common foods.   Discussed with patient that given above clinical history and test results there is a possibility that the environmental allergies especially the tree pollen may have contributed to his symptoms. However, can't rule out some delayed immune reaction from the covid vaccine either - less likely as he had no other symptoms.   Discussed environmental control measures.  May use over the counter antihistamines such as Zyrtec (cetirizine), Claritin (loratadine), Allegra (fexofenadine), or Xyzal (levocetirizine) daily as needed.  If you have another breakout of hives or swelling, start taking zyrtec 76m twice a day, take pictures and let uKoreaknow. May need additional work up at that time.

## 2019-11-14 NOTE — Assessment & Plan Note (Signed)
Rhino conjunctivitis symptoms during the spring months. Usually takes benadryl prn with good benefit. No previous allergy testing.  Today's skin testing showed: Positive to grass pollen, tree pollen, mold, dust mites, cat.  Start environmental control measures.  May use over the counter antihistamines such as Zyrtec (cetirizine), Claritin (loratadine), Allegra (fexofenadine), or Xyzal (levocetirizine) daily as needed.

## 2019-11-16 ENCOUNTER — Other Ambulatory Visit: Payer: Self-pay

## 2019-11-16 ENCOUNTER — Ambulatory Visit (INDEPENDENT_AMBULATORY_CARE_PROVIDER_SITE_OTHER): Payer: BC Managed Care – PPO | Admitting: Pharmacist Clinician (PhC)/ Clinical Pharmacy Specialist

## 2019-11-16 DIAGNOSIS — Z7901 Long term (current) use of anticoagulants: Secondary | ICD-10-CM | POA: Diagnosis not present

## 2019-11-16 DIAGNOSIS — Z952 Presence of prosthetic heart valve: Secondary | ICD-10-CM

## 2019-11-16 LAB — POCT INR: INR: 2.3 (ref 2.0–3.0)

## 2019-11-19 ENCOUNTER — Encounter: Payer: Self-pay | Admitting: Internal Medicine

## 2019-11-19 ENCOUNTER — Encounter: Payer: Self-pay | Admitting: Family Medicine

## 2019-11-19 NOTE — Telephone Encounter (Signed)
Last OV 11/02/19 Last fill for Warfarin 11/30/17 Historical provider Last fill for Ezetimibe 05/17/19  #90/1

## 2019-11-20 MED ORDER — WARFARIN SODIUM 5 MG PO TABS: ORAL_TABLET | ORAL | 3 refills | Status: DC

## 2019-11-20 MED ORDER — EZETIMIBE 10 MG PO TABS: 10.0000 mg | ORAL_TABLET | Freq: Every day | ORAL | 3 refills | Status: DC

## 2019-11-21 ENCOUNTER — Other Ambulatory Visit: Payer: Self-pay | Admitting: Family Medicine

## 2019-11-29 LAB — LIPID PANEL
Chol/HDL Ratio: 5.9 ratio — ABNORMAL HIGH (ref 0.0–5.0)
Cholesterol, Total: 188 mg/dL (ref 100–199)
HDL: 32 mg/dL — ABNORMAL LOW (ref >39)
LDL Chol Calc (NIH): 110 mg/dL — ABNORMAL HIGH (ref 0–99)
Triglycerides: 262 mg/dL — ABNORMAL HIGH (ref 0–149)
VLDL Cholesterol Cal: 46 mg/dL — ABNORMAL HIGH (ref 5–40)

## 2019-11-30 ENCOUNTER — Telehealth: Payer: Self-pay | Admitting: Emergency Medicine

## 2019-11-30 NOTE — Telephone Encounter (Signed)
Left message for patient to return call regarding results  

## 2019-12-03 ENCOUNTER — Telehealth: Payer: Self-pay | Admitting: Emergency Medicine

## 2019-12-03 NOTE — Telephone Encounter (Signed)
Called patient informed him of test results.Informed him Dr. Agustin Cree would like him to start vascepa if it is not going to be too expensive. He wants [place] to send to pharmacy and see if patient will be able to afford if not patient was instructed to call our office.Will confirm dose of vascepa with Dr. Agustin Cree and send in. Patient understood all information no further questions.

## 2019-12-07 MED ORDER — ICOSAPENT ETHYL 1 G PO CAPS: 2.0000 g | ORAL_CAPSULE | Freq: Two times a day (BID) | ORAL | 3 refills | Status: DC

## 2019-12-14 ENCOUNTER — Other Ambulatory Visit: Payer: Self-pay

## 2019-12-14 ENCOUNTER — Ambulatory Visit (INDEPENDENT_AMBULATORY_CARE_PROVIDER_SITE_OTHER): Payer: BC Managed Care – PPO | Admitting: Pharmacist Clinician (PhC)/ Clinical Pharmacy Specialist

## 2019-12-14 DIAGNOSIS — Z7901 Long term (current) use of anticoagulants: Secondary | ICD-10-CM | POA: Diagnosis not present

## 2019-12-14 DIAGNOSIS — Z952 Presence of prosthetic heart valve: Secondary | ICD-10-CM | POA: Diagnosis not present

## 2019-12-14 LAB — POCT INR: INR: 2.6 (ref 2.0–3.0)

## 2019-12-24 ENCOUNTER — Ambulatory Visit: Payer: BC Managed Care – PPO | Admitting: Internal Medicine

## 2019-12-24 ENCOUNTER — Encounter: Payer: Self-pay | Admitting: Internal Medicine

## 2019-12-24 VITALS — BP 120/74 | HR 64 | Temp 98.4°F | Ht 66.0 in | Wt 187.0 lb

## 2019-12-24 DIAGNOSIS — Z7901 Long term (current) use of anticoagulants: Secondary | ICD-10-CM | POA: Diagnosis not present

## 2019-12-24 DIAGNOSIS — Z1211 Encounter for screening for malignant neoplasm of colon: Secondary | ICD-10-CM

## 2019-12-24 MED ORDER — NA SULFATE-K SULFATE-MG SULF 17.5-3.13-1.6 GM/177ML PO SOLN: 1.0000 | Freq: Once | ORAL | 0 refills | Status: AC

## 2019-12-24 NOTE — Progress Notes (Signed)
HISTORY OF PRESENT ILLNESS:  Larry Parks is a 44 y.o. male, native of [place] and current associate Professor of social work at [employer], who presents today to discuss colon cancer screening after revision the most recent guidelines.  His past medical history is remarkable for thoracic aortic aneurysm repair and aortic valve replacement for which she is on chronic Coumadin therapy.  No family history of colon cancer.  GI review of systems remarkable for rare heartburn and gas.  Recent blood work from March 2021 with normal CBC.  Hemoglobin 14.7.  REVIEW OF SYSTEMS:  All non-GI ROS are negative  Past Medical History:  Diagnosis Date  . Depression   . Hyperlipidemia   . Sleep apnea    uses a CPAP    Past Surgical History:  Procedure Laterality Date  . aortic valve replaced  11/2017   aortic aneurism    Social History Larry Parks  reports that he quit smoking about 17 years ago. He has never used smokeless tobacco. He reports that he does not drink alcohol or use drugs.  family history includes Diabetes in his father; Heart attack in his father.  No Known Allergies     PHYSICAL EXAMINATION: Vital signs: BP 120/74   Pulse 64   Temp 98.4 F (36.9 C)   Ht 5\' 6"  (1.676 m)   Wt 187 lb (84.8 kg)   BMI 30.18 kg/m   Constitutional: generally well-appearing, no acute distress Psychiatric: alert and oriented x3, cooperative Eyes: extraocular movements intact, anicteric, conjunctiva pink Mouth: oral pharynx moist, no lesions Neck: supple no lymphadenopathy Cardiovascular: heart regular rate and rhythm, no murmur.  Mechanical heart sounds Lungs: clear to auscultation bilaterally Abdomen: soft, nontender, nondistended, no obvious ascites, no peritoneal signs, normal bowel sounds, no organomegaly Rectal: Deferred until colonoscopy Extremities: no clubbing, cyanosis, or lower extremity edema bilaterally Skin: no lesions on visible extremities Neuro: No focal  deficits.  Cranial nerves intact  ASSESSMENT:  1.  Colon cancer screening in a 44 year old.  Motivated.  Baseline risk for neoplasia 2.  Status post thoracic aneurysm repair and AVR on Coumadin.  High risk for procedure   PLAN:  1.  I discussed with him options for colon cancer screening including the 2 stool base studies-FIT and Cologuard.  We discussed how the tests work and the implications of positive and negative results.  Also follow-up intervals and relative cost.  We then discussed optical colonoscopy.  In particular as it relates to addressing his Coumadin therapy.  To this and he has elected for optical colonoscopy.  He is HIGH RISK.  We will perform the examination ON Coumadin and plan on checking INR 1 to 3 days prior to his procedure.  We also discussed the limitations for therapeutics should he have advanced lesion.  He understands all of this well and wishes to proceed.The nature of the procedure, as well as the risks, benefits, and alternatives were carefully and thoroughly reviewed with the patient. Ample time for discussion and questions allowed. The patient understood, was satisfied, and agreed to proceed.

## 2019-12-24 NOTE — Patient Instructions (Signed)
You have been scheduled for a colonoscopy. Please follow written instructions given to you at your visit today.  Please pick up your prep supplies at the pharmacy within the next 1-3 days. If you use inhalers (even only as needed), please bring them with you on the day of your procedure.  Stay ON your Coumadin.  Come by the lab between 8/5 on 02/15/2020 to have your lab drawn.

## 2020-01-04 ENCOUNTER — Ambulatory Visit (INDEPENDENT_AMBULATORY_CARE_PROVIDER_SITE_OTHER): Payer: BC Managed Care – PPO | Admitting: Pharmacist

## 2020-01-04 ENCOUNTER — Other Ambulatory Visit: Payer: Self-pay

## 2020-01-04 DIAGNOSIS — Z952 Presence of prosthetic heart valve: Secondary | ICD-10-CM

## 2020-01-04 DIAGNOSIS — Z7901 Long term (current) use of anticoagulants: Secondary | ICD-10-CM

## 2020-01-04 LAB — POCT INR: INR: 2.1 (ref 2.0–3.0)

## 2020-02-06 ENCOUNTER — Encounter: Payer: Self-pay | Admitting: Internal Medicine

## 2020-02-15 ENCOUNTER — Other Ambulatory Visit (INDEPENDENT_AMBULATORY_CARE_PROVIDER_SITE_OTHER): Payer: BC Managed Care – PPO

## 2020-02-15 DIAGNOSIS — Z1211 Encounter for screening for malignant neoplasm of colon: Secondary | ICD-10-CM

## 2020-02-15 LAB — PROTIME-INR
INR: 2.2 ratio — ABNORMAL HIGH (ref 0.8–1.0)
Prothrombin Time: 24.8 s — ABNORMAL HIGH (ref 9.6–13.1)

## 2020-02-19 ENCOUNTER — Ambulatory Visit (AMBULATORY_SURGERY_CENTER): Payer: BC Managed Care – PPO | Admitting: Internal Medicine

## 2020-02-19 ENCOUNTER — Encounter: Payer: Self-pay | Admitting: Internal Medicine

## 2020-02-19 ENCOUNTER — Other Ambulatory Visit: Payer: Self-pay

## 2020-02-19 VITALS — BP 111/76 | HR 60 | Temp 97.6°F | Resp 21 | Ht 66.0 in | Wt 187.0 lb

## 2020-02-19 DIAGNOSIS — D123 Benign neoplasm of transverse colon: Secondary | ICD-10-CM | POA: Diagnosis not present

## 2020-02-19 DIAGNOSIS — K635 Polyp of colon: Secondary | ICD-10-CM | POA: Diagnosis not present

## 2020-02-19 DIAGNOSIS — Z1211 Encounter for screening for malignant neoplasm of colon: Secondary | ICD-10-CM

## 2020-02-19 MED ORDER — SODIUM CHLORIDE 0.9 % IV SOLN: 500.0000 mL | Freq: Once | INTRAVENOUS | Status: DC

## 2020-02-19 NOTE — Progress Notes (Signed)
Called to room to assist during endoscopic procedure.  Patient ID and intended procedure confirmed with present staff. Received instructions for my participation in the procedure from the performing physician.  

## 2020-02-19 NOTE — Patient Instructions (Signed)
Please read handouts provided. Continue present medications. Await pathology results. Resume Coumadin ( warfarin ) at prior dose today.     YOU HAD AN ENDOSCOPIC PROCEDURE TODAY AT [facility]:   Refer to the procedure report that was given to you for any specific questions about what was found during the examination.  If the procedure report does not answer your questions, please call your gastroenterologist to clarify.  If you requested that your care partner not be given the details of your procedure findings, then the procedure report has been included in a sealed envelope for you to review at your convenience later.  YOU SHOULD EXPECT: Some feelings of bloating in the abdomen. Passage of more gas than usual.  Walking can help get rid of the air that was put into your GI tract during the procedure and reduce the bloating. If you had a lower endoscopy (such as a colonoscopy or flexible sigmoidoscopy) you may notice spotting of blood in your stool or on the toilet paper. If you underwent a bowel prep for your procedure, you may not have a normal bowel movement for a few days.  Please Note:  You might notice some irritation and congestion in your nose or some drainage.  This is from the oxygen used during your procedure.  There is no need for concern and it should clear up in a day or so.  SYMPTOMS TO REPORT IMMEDIATELY:   Following lower endoscopy (colonoscopy or flexible sigmoidoscopy):  Excessive amounts of blood in the stool  Significant tenderness or worsening of abdominal pains  Swelling of the abdomen that is new, acute  Fever of 100F or higher    For urgent or emergent issues, a gastroenterologist can be reached at any hour by calling [phone]. Do not use MyChart messaging for urgent concerns.    DIET:  We do recommend a small meal at first, but then you may proceed to your regular diet.  Drink plenty of fluids but you should avoid alcoholic beverages for  24 hours.  ACTIVITY:  You should plan to take it easy for the rest of today and you should NOT DRIVE or use heavy machinery until tomorrow (because of the sedation medicines used during the test).    FOLLOW UP: Our staff will call the number listed on your records 48-72 hours following your procedure to check on you and address any questions or concerns that you may have regarding the information given to you following your procedure. If we do not reach you, we will leave a message.  We will attempt to reach you two times.  During this call, we will ask if you have developed any symptoms of COVID 19. If you develop any symptoms (ie: fever, flu-like symptoms, shortness of breath, cough etc.) before then, please call [phone].  If you test positive for Covid 19 in the 2 weeks post procedure, please call and report this information to [place].    If any biopsies were taken you will be contacted by phone or by letter within the next 1-3 weeks.  Please call us at [phone] if you have not heard about the biopsies in 3 weeks.    SIGNATURES/CONFIDENTIALITY: You and/or your care partner have signed paperwork which will be entered into your electronic medical record.  These signatures attest to the fact that that the information above on your After Visit Summary has been reviewed and is understood.  Full responsibility of the confidentiality of this discharge information  lies with you and/or your care-partner.

## 2020-02-19 NOTE — Progress Notes (Signed)
PT taken to PACU. Monitors in place. VSS. Report given to RN. 

## 2020-02-19 NOTE — Progress Notes (Signed)
JB- Check-in  CW - VS   

## 2020-02-19 NOTE — Op Note (Signed)
[name] Procedure Date: 02/19/2020 1:26 PM MRN: [id number] Endoscopist: Docia Chuck. Henrene Pastor , MD Age: 44 Referring MD:  Date of Birth: 12/20/1975 Gender: Male Account #: [id number] Procedure:                Colonoscopy with cold snare polypectomy x 1 Indications:              Screening for colorectal malignant neoplasm Medicines:                Monitored Anesthesia Care Procedure:                Pre-Anesthesia Assessment:                           - Prior to the procedure, a History and Physical                            was performed, and patient medications and                            allergies were reviewed. The patient's tolerance of                            previous anesthesia was also reviewed. The risks                            and benefits of the procedure and the sedation                            options and risks were discussed with the patient.                            All questions were answered, and informed consent                            was obtained. Prior Anticoagulants: The patient has                            taken Coumadin (warfarin), last dose was 1 day                            prior to procedure. ASA Grade Assessment: III - A                            patient with severe systemic disease. After                            reviewing the risks and benefits, the patient was                            deemed in satisfactory condition to undergo the                            procedure.  After obtaining informed consent, the colonoscope                            was passed under direct vision. Throughout the                            procedure, the patient's blood pressure, pulse, and                            oxygen saturations were monitored continuously. The                            Colonoscope was introduced through the anus and                            advanced to the the  cecum, identified by                            appendiceal orifice and ileocecal valve. The                            ileocecal valve, appendiceal orifice, and rectum                            were photographed. The quality of the bowel                            preparation was adequate to identify polyps. time                            taken to clear legume debris. The colonoscopy was                            performed without difficulty. The patient tolerated                            the procedure well. The bowel preparation used was                            SUPREP via split dose instruction. Scope In: 1:44:56 PM Scope Out: 2:07:47 PM Scope Withdrawal Time: 0 hours 18 minutes 11 seconds  Total Procedure Duration: 0 hours 22 minutes 51 seconds  Findings:                 A 3 mm polyp was found in the transverse colon. The                            polyp was removed with a cold snare. Resection and                            retrieval were complete.                           The exam was otherwise without abnormality on  direct and retroflexion views. Complications:            No immediate complications. Estimated blood loss:                            None. Estimated Blood Loss:     Estimated blood loss: none. Impression:               - One 3 mm polyp in the transverse colon, removed                            with a cold snare. Resected and retrieved.                           - The examination was otherwise normal on direct                            and retroflexion views. Recommendation:           - Repeat colonoscopy in 7-10 years for surveillance.                           - Resume Coumadin (warfarin) today at prior dose.                           - Patient has a contact number available for                            emergencies. The signs and symptoms of potential                            delayed complications were discussed with the                             patient. Return to normal activities tomorrow.                            Written discharge instructions were provided to the                            patient.                           - Resume previous diet.                           - Continue present medications.                           - Await pathology results. Docia Chuck. Henrene Pastor, MD 02/19/2020 2:17:24 PM This report has been signed electronically.

## 2020-02-21 ENCOUNTER — Telehealth: Payer: Self-pay

## 2020-02-21 NOTE — Telephone Encounter (Signed)
  Follow up Call-  Call back number 02/19/2020  Post procedure Call Back phone  # [phone] cell  Permission to leave phone message Yes     Patient questions:  Do you have a fever, pain , or abdominal swelling? No. Pain Score  0 *  Have you tolerated food without any problems? Yes.    Have you been able to return to your normal activities? Yes.    Do you have any questions about your discharge instructions: Diet   No. Medications  No. Follow up visit  No.  Do you have questions or concerns about your Care? No.  Actions: * If pain score is 4 or above: No action needed, pain <4.   1. Have you developed a fever since your procedure? no  2.   Have you had an respiratory symptoms (SOB or cough) since your procedure? No   3.   Have you tested positive for COVID 19 since your procedure no  4.   Have you had any family members/close contacts diagnosed with the COVID 19 since your procedure?  No    If yes to any of these questions please route to Joylene John, RN and Erenest Rasher, RN

## 2020-02-26 ENCOUNTER — Encounter: Payer: Self-pay | Admitting: Internal Medicine

## 2020-02-27 ENCOUNTER — Telehealth: Payer: Self-pay | Admitting: Cardiology

## 2020-02-27 DIAGNOSIS — E785 Hyperlipidemia, unspecified: Secondary | ICD-10-CM

## 2020-02-27 NOTE — Telephone Encounter (Signed)
A patient is looking to speak with a dr. Agustin Cree or a nurse regarding an upcoming appt.

## 2020-02-27 NOTE — Telephone Encounter (Signed)
Spoke with pt who wants to know what labs are needed before his next visit. He said a message in [place] would be great once we know what he needs.

## 2020-02-28 NOTE — Addendum Note (Signed)
Addended by: Ashok Norris on: 02/28/2020 03:57 PM   Modules accepted: Orders

## 2020-02-28 NOTE — Telephone Encounter (Signed)
Lets get fasting lipid profile and Chem-7 before next visit

## 2020-02-28 NOTE — Telephone Encounter (Signed)
[facility] message sent with this information and lab orders put in.

## 2020-02-29 ENCOUNTER — Ambulatory Visit (INDEPENDENT_AMBULATORY_CARE_PROVIDER_SITE_OTHER): Payer: BC Managed Care – PPO | Admitting: Pharmacist Clinician (PhC)/ Clinical Pharmacy Specialist

## 2020-02-29 ENCOUNTER — Other Ambulatory Visit: Payer: Self-pay

## 2020-02-29 DIAGNOSIS — Z952 Presence of prosthetic heart valve: Secondary | ICD-10-CM | POA: Diagnosis not present

## 2020-02-29 DIAGNOSIS — Z7901 Long term (current) use of anticoagulants: Secondary | ICD-10-CM

## 2020-02-29 LAB — POCT INR: INR: 2.1 (ref 2.0–3.0)

## 2020-03-14 ENCOUNTER — Telehealth: Payer: Self-pay | Admitting: Emergency Medicine

## 2020-03-14 DIAGNOSIS — Z952 Presence of prosthetic heart valve: Secondary | ICD-10-CM

## 2020-03-14 NOTE — Telephone Encounter (Signed)
Please see routing comment.

## 2020-03-17 NOTE — Telephone Encounter (Signed)
Test ordered and scheduled. Patient aware.

## 2020-03-17 NOTE — Addendum Note (Signed)
Addended by: Ashok Norris on: 03/17/2020 10:31 AM   Modules accepted: Orders

## 2020-03-17 NOTE — Telephone Encounter (Signed)
Order placed. Scheduler working to get him scheduled.

## 2020-03-17 NOTE — Telephone Encounter (Signed)
He needs to have a yearly echo that means he need to have an echo in August

## 2020-03-21 ENCOUNTER — Other Ambulatory Visit: Payer: Self-pay

## 2020-03-21 ENCOUNTER — Ambulatory Visit (INDEPENDENT_AMBULATORY_CARE_PROVIDER_SITE_OTHER): Payer: BC Managed Care – PPO | Admitting: Pharmacist

## 2020-03-21 DIAGNOSIS — Z7901 Long term (current) use of anticoagulants: Secondary | ICD-10-CM

## 2020-03-21 DIAGNOSIS — Z952 Presence of prosthetic heart valve: Secondary | ICD-10-CM

## 2020-03-21 LAB — POCT INR: INR: 1.9 — AB (ref 2.0–3.0)

## 2020-03-26 ENCOUNTER — Other Ambulatory Visit: Payer: Self-pay

## 2020-03-26 ENCOUNTER — Other Ambulatory Visit (HOSPITAL_COMMUNITY): Payer: BC Managed Care – PPO

## 2020-03-26 ENCOUNTER — Ambulatory Visit (HOSPITAL_COMMUNITY)
Admission: RE | Admit: 2020-03-26 | Discharge: 2020-03-26 | Disposition: A | Discharge status: Home / Self Care | Payer: BC Managed Care – PPO | Source: Ambulatory Visit | Attending: Cardiology | Admitting: Cardiology

## 2020-03-26 DIAGNOSIS — G4730 Sleep apnea, unspecified: Secondary | ICD-10-CM | POA: Insufficient documentation

## 2020-03-26 DIAGNOSIS — I082 Rheumatic disorders of both aortic and tricuspid valves: Secondary | ICD-10-CM | POA: Insufficient documentation

## 2020-03-26 DIAGNOSIS — E785 Hyperlipidemia, unspecified: Secondary | ICD-10-CM | POA: Insufficient documentation

## 2020-03-26 DIAGNOSIS — I10 Essential (primary) hypertension: Secondary | ICD-10-CM | POA: Diagnosis not present

## 2020-03-26 DIAGNOSIS — Z952 Presence of prosthetic heart valve: Secondary | ICD-10-CM

## 2020-03-26 NOTE — Progress Notes (Signed)
  Echocardiogram 2D Echocardiogram has been performed.  Larry Parks [name] 03/26/2020, 2:41 PM

## 2020-03-27 LAB — ECHOCARDIOGRAM COMPLETE
AR max vel: 2 cm2
AV Area VTI: 2.16 cm2
AV Area mean vel: 1.93 cm2
AV Mean grad: 9.5 mmHg
AV Peak grad: 16.4 mmHg
Ao pk vel: 2.03 m/s
Area-P 1/2: 3.77 cm2
P 1/2 time: 763 msec
S' Lateral: 2.6 cm

## 2020-04-07 ENCOUNTER — Encounter: Payer: Self-pay | Admitting: Family Medicine

## 2020-04-15 ENCOUNTER — Other Ambulatory Visit: Payer: Self-pay

## 2020-04-16 ENCOUNTER — Ambulatory Visit: Payer: BC Managed Care – PPO | Admitting: Family Medicine

## 2020-04-18 ENCOUNTER — Ambulatory Visit (INDEPENDENT_AMBULATORY_CARE_PROVIDER_SITE_OTHER): Payer: BC Managed Care – PPO | Admitting: Pharmacist

## 2020-04-18 ENCOUNTER — Other Ambulatory Visit: Payer: Self-pay

## 2020-04-18 DIAGNOSIS — Z7901 Long term (current) use of anticoagulants: Secondary | ICD-10-CM

## 2020-04-18 DIAGNOSIS — Z952 Presence of prosthetic heart valve: Secondary | ICD-10-CM | POA: Diagnosis not present

## 2020-04-18 LAB — POCT INR: INR: 1.7 — AB (ref 2.0–3.0)

## 2020-04-18 NOTE — Patient Instructions (Signed)
Description   Continue to 1.5 tablets daily except 1 tablet each Friday.  Next INR in 4 weeks. Call clinic at [phone] for changes in medication or procedures

## 2020-04-19 LAB — BASIC METABOLIC PANEL
BUN/Creatinine Ratio: 20 (ref 9–20)
BUN: 16 mg/dL (ref 6–24)
CO2: 23 mmol/L (ref 20–29)
Calcium: 9.1 mg/dL (ref 8.7–10.2)
Chloride: 105 mmol/L (ref 96–106)
Creatinine, Ser: 0.81 mg/dL (ref 0.76–1.27)
GFR calc Af Amer: 123 mL/min/{1.73_m2} (ref >59)
GFR calc non Af Amer: 107 mL/min/{1.73_m2} (ref >59)
Glucose: 106 mg/dL — ABNORMAL HIGH (ref 65–99)
Potassium: 3.8 mmol/L (ref 3.5–5.2)
Sodium: 142 mmol/L (ref 134–144)

## 2020-04-19 LAB — LIPID PANEL
Chol/HDL Ratio: 6.5 ratio — ABNORMAL HIGH (ref 0.0–5.0)
Cholesterol, Total: 189 mg/dL (ref 100–199)
HDL: 29 mg/dL — ABNORMAL LOW (ref >39)
LDL Chol Calc (NIH): 75 mg/dL (ref 0–99)
Triglycerides: 536 mg/dL — ABNORMAL HIGH (ref 0–149)
VLDL Cholesterol Cal: 85 mg/dL — ABNORMAL HIGH (ref 5–40)

## 2020-04-21 ENCOUNTER — Ambulatory Visit: Payer: BC Managed Care – PPO | Admitting: Cardiology

## 2020-04-23 ENCOUNTER — Encounter: Payer: Self-pay | Admitting: Cardiology

## 2020-04-23 ENCOUNTER — Other Ambulatory Visit: Payer: Self-pay

## 2020-04-23 ENCOUNTER — Ambulatory Visit: Payer: BC Managed Care – PPO | Admitting: Cardiology

## 2020-04-23 VITALS — BP 120/80 | HR 73 | Ht 66.0 in | Wt 183.0 lb

## 2020-04-23 DIAGNOSIS — Z9889 Other specified postprocedural states: Secondary | ICD-10-CM | POA: Diagnosis not present

## 2020-04-23 DIAGNOSIS — Z952 Presence of prosthetic heart valve: Secondary | ICD-10-CM | POA: Diagnosis not present

## 2020-04-23 DIAGNOSIS — E785 Hyperlipidemia, unspecified: Secondary | ICD-10-CM | POA: Diagnosis not present

## 2020-04-23 DIAGNOSIS — E781 Pure hyperglyceridemia: Secondary | ICD-10-CM | POA: Insufficient documentation

## 2020-04-23 DIAGNOSIS — Z8679 Personal history of other diseases of the circulatory system: Secondary | ICD-10-CM

## 2020-04-23 HISTORY — DX: Pure hyperglyceridemia: E78.1

## 2020-04-23 NOTE — Progress Notes (Signed)
Cardiology Office Note:    Date:  04/23/2020   ID:  Larry Parks, DOB 06-19-1976, MRN [id number]  PCP:  Ronnald Nian, DO  Cardiologist:  Jenne Campus, MD    Referring MD: Ronnald Nian, DO   Chief Complaint  Patient presents with  . Follow-up  Doing well  History of Present Illness:    Larry Parks is a 44 y.o. 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  Comes today to my office for follow-up overall doing great.  Asymptomatic, no chest pain tightness squeezing pressure burning chest.  Denies have any palpitations.  He try to walk on the regular basis.  Past Medical History:  Diagnosis Date  . Allergy   . Anxiety   . Depression   . Dissecting aortic aneurysm (any part), thoracic ([place])    2019  . Hyperlipidemia   . Hypertension   . Sleep apnea    uses a CPAP    Past Surgical History:  Procedure Laterality Date  . aortic valve replaced  11/2017   aortic aneurism    Current Medications: Current Meds  Medication Sig  . Cholecalciferol (VITAMIN D3) 125 MCG (5000 UT) CAPS Take 1 capsule by mouth daily.  Marland Kitchen co-enzyme Q-10 30 MG capsule Take 30 mg by mouth 3 (three) times daily.  Marland Kitchen ezetimibe (ZETIA) 10 MG tablet Take 1 tablet (10 mg total) by mouth daily.  . melatonin 5 MG TABS Take 5 mg by mouth at bedtime.  . Omega-3 Fatty Acids (FISH OIL PO) 2 capsules twice a day  . warfarin (COUMADIN) 5 MG tablet Pt taking 7.5 mg Tuesday Thursday Saturday and Sunday 10 mg Mon Wed Friday     Allergies:   Pollen extract   Social History   Socioeconomic History  . Marital status: Married    Spouse name: Not on file  . Number of children: 2  . Years of education: Not on file  . Highest education level: Not on file  Occupational History  . Occupation: Assoc. prof. @ [employer]  Tobacco Use  . Smoking status: Former Smoker    Types: Cigarettes    Quit date: 2004    Years since  quitting: 17.6  . Smokeless tobacco: Never Used  Vaping Use  . Vaping Use: Never used  Substance and Sexual Activity  . Alcohol use: Yes    Comment: socially - last 2008  . Drug use: Never  . Sexual activity: Not on file  Other Topics Concern  . Not on file  Social History Narrative  . Not on file   Social Determinants of Health   Financial Resource Strain:   . Difficulty of Paying Living Expenses: Not on file  Food Insecurity:   . Worried About Charity fundraiser in the Last Year: Not on file  . Ran Out of Food in the Last Year: Not on file  Transportation Needs:   . Lack of Transportation (Medical): Not on file  . Lack of Transportation (Non-Medical): Not on file  Physical Activity:   . Days of Exercise per Week: Not on file  . Minutes of Exercise per Session: Not on file  Stress:   . Feeling of Stress : Not on file  Social Connections:   . Frequency of Communication with Friends and Family: Not on file  . Frequency of Social Gatherings with Friends and Family: Not on file  . Attends Religious Services: Not  on file  . Active Member of Clubs or Organizations: Not on file  . Attends Archivist Meetings: Not on file  . Marital Status: Not on file     Family History: The patient's family history includes Diabetes in his father; Heart attack in his father. There is no history of Colon cancer, Esophageal cancer, Rectal cancer, Stomach cancer, or Prostate cancer. ROS:   Please see the history of present illness.    All 14 point review of systems negative except as described per history of present illness  EKGs/Labs/Other Studies Reviewed:      Recent Labs: 10/22/2019: ALT 40; Hemoglobin 14.7; Platelets 220.0 04/18/2020: BUN 16; Creatinine, Ser 0.81; Potassium 3.8; Sodium 142  Recent Lipid Panel    Component Value Date/Time   CHOL 189 04/18/2020 1645   TRIG 536 (H) 04/18/2020 1645   HDL 29 (L) 04/18/2020 1645   CHOLHDL 6.5 (H) 04/18/2020 1645   LDLCALC 75  04/18/2020 1645    Physical Exam:    VS:  BP 120/80 (BP Location: Right Arm, Patient Position: Sitting, Cuff Size: Normal)   Pulse 73   Ht 5\' 6"  (1.676 m)   Wt 183 lb (83 kg)   SpO2 98%   BMI 29.54 kg/m     Wt Readings from Last 3 Encounters:  04/23/20 183 lb (83 kg)  02/19/20 187 lb (84.8 kg)  12/24/19 187 lb (84.8 kg)     GEN:  Well nourished, well developed in no acute distress HEENT: Normal NECK: No JVD; No carotid bruits LYMPHATICS: No lymphadenopathy CARDIAC: RRR, mechanical valve sounds are crisp, soft systolic murmur, no rubs, no gallops RESPIRATORY:  Clear to auscultation without rales, wheezing or rhonchi  ABDOMEN: Soft, non-tender, non-distended MUSCULOSKELETAL:  No edema; No deformity  SKIN: Warm and dry LOWER EXTREMITIES: no swelling NEUROLOGIC:  Alert and oriented x 3 PSYCHIATRIC:  Normal affect   ASSESSMENT:    1. Status post aortic valve replacement   2. Status post thoracic aortic aneurysm repair   3. Dyslipidemia   4. Hypertriglyceridemia    PLAN:    In order of problems listed above:  1. Status post aortic valve replacement.  Last echocardiogram reviewed showing normal valve function preserved left ventricle ejection fraction insignificant leak from mitral valve as well as aortic prosthesis.  There is a hemodynamically not significant.  We will continue present management.  He knows about need to endocarditis prophylaxis, anticoagulated. 2. Status post aortic aneurysm repair.  Doing well from that point review last CT reviewed from 2019 stable. 3. Dyslipidemia: He is on Zetia only, concerning is the fact that he does have high triglycerides.  I did review his K PN which showed triglycerides of 536 pelvis it is very concerning.  We did talk about measures that he can use to improve his triglycerides.  We talked about need to exercise avoiding carbohydrates.  He also tell me last time when he had cholesterol checked he was not fasting, therefore, will  repeat fasting lipid profile.  I will also check hemoglobin A1c make sure he does not have diabetes.   Medication Adjustments/Labs and Tests Ordered: Current medicines are reviewed at length with the patient today.  Concerns regarding medicines are outlined above.  No orders of the defined types were placed in this encounter.  Medication changes: No orders of the defined types were placed in this encounter.   Signed, Park Liter, MD, [facility] 04/23/2020 1:39 PM    [place]

## 2020-04-23 NOTE — Patient Instructions (Signed)
Medication Instructions:  Your physician recommends that you continue on your current medications as directed. Please refer to the Current Medication list given to you today.  *If you need a refill on your cardiac medications before your next appointment, please call your pharmacy*   Lab Work: Your physician recommends that you return for lab work when fasting: lipid, hemoglobin a 1c   If you have labs (blood work) drawn today and your tests are completely normal, you will receive your results only by: Marland Kitchen MyChart Message (if you have MyChart) OR . A paper copy in the mail If you have any lab test that is abnormal or we need to change your treatment, we will call you to review the results.   Testing/Procedures: None.    Follow-Up: At [facility], you and your health needs are our priority.  As part of our continuing mission to provide you with exceptional heart care, we have created designated Provider Care Teams.  These Care Teams include your primary Cardiologist (physician) and Advanced Practice Providers (APPs -  Physician Assistants and Nurse Practitioners) who all work together to provide you with the care you need, when you need it.  We recommend signing up for the patient portal called "MyChart".  Sign up information is provided on this After Visit Summary.  MyChart is used to connect with patients for Virtual Visits (Telemedicine).  Patients are able to view lab/test results, encounter notes, upcoming appointments, etc.  Non-urgent messages can be sent to your provider as well.   To learn more about what you can do with MyChart, go to [email or web address].    Your next appointment:   6 month(s)  The format for your next appointment:   In Person  Provider:   Jenne Campus, MD   Other Instructions

## 2020-05-03 LAB — LIPID PANEL
Chol/HDL Ratio: 6.7 ratio — ABNORMAL HIGH (ref 0.0–5.0)
Cholesterol, Total: 200 mg/dL — ABNORMAL HIGH (ref 100–199)
HDL: 30 mg/dL — ABNORMAL LOW (ref >39)
LDL Chol Calc (NIH): 134 mg/dL — ABNORMAL HIGH (ref 0–99)
Triglycerides: 197 mg/dL — ABNORMAL HIGH (ref 0–149)
VLDL Cholesterol Cal: 36 mg/dL (ref 5–40)

## 2020-05-03 LAB — HEMOGLOBIN A1C

## 2020-05-05 ENCOUNTER — Telehealth: Payer: Self-pay | Admitting: Cardiology

## 2020-05-05 DIAGNOSIS — E785 Hyperlipidemia, unspecified: Secondary | ICD-10-CM

## 2020-05-05 MED ORDER — ROSUVASTATIN CALCIUM 10 MG PO TABS: 10.0000 mg | ORAL_TABLET | Freq: Every day | ORAL | 1 refills | Status: DC

## 2020-05-05 NOTE — Telephone Encounter (Signed)
Follow Up:     Pt is returning your call, concerning his lab results.

## 2020-05-05 NOTE — Telephone Encounter (Signed)
Called patient. Informed him of results and recommendations. He will start crestor 10 mg daily and have lab work redone in 6 weeks, no further questions.

## 2020-05-16 ENCOUNTER — Other Ambulatory Visit: Payer: Self-pay

## 2020-05-16 ENCOUNTER — Ambulatory Visit (INDEPENDENT_AMBULATORY_CARE_PROVIDER_SITE_OTHER): Payer: BC Managed Care – PPO

## 2020-05-16 DIAGNOSIS — Z7901 Long term (current) use of anticoagulants: Secondary | ICD-10-CM

## 2020-05-16 DIAGNOSIS — Z5181 Encounter for therapeutic drug level monitoring: Secondary | ICD-10-CM

## 2020-05-16 DIAGNOSIS — Z952 Presence of prosthetic heart valve: Secondary | ICD-10-CM

## 2020-05-16 LAB — POCT INR: INR: 1.9 — AB (ref 2.0–3.0)

## 2020-05-16 NOTE — Patient Instructions (Signed)
Continue 1.5 tablets daily except 1 tablet each Friday.  Next INR in 6 weeks. Call clinic at [phone] for changes in medication or procedures 

## 2020-05-22 ENCOUNTER — Encounter: Payer: Self-pay | Admitting: Neurology

## 2020-05-22 ENCOUNTER — Other Ambulatory Visit: Payer: Self-pay

## 2020-05-22 ENCOUNTER — Ambulatory Visit (INDEPENDENT_AMBULATORY_CARE_PROVIDER_SITE_OTHER): Payer: BC Managed Care – PPO | Admitting: Neurology

## 2020-05-22 VITALS — BP 128/78 | HR 76 | Ht 66.0 in | Wt 182.0 lb

## 2020-05-22 DIAGNOSIS — G4733 Obstructive sleep apnea (adult) (pediatric): Secondary | ICD-10-CM

## 2020-05-22 DIAGNOSIS — Z9989 Dependence on other enabling machines and devices: Secondary | ICD-10-CM | POA: Diagnosis not present

## 2020-05-22 NOTE — Patient Instructions (Signed)
It was good to see you again today.  You are fully compliant with your AutoPap.  As discussed, the recall affects your DreamStation machine, please stay in touch with your DME company, [employer], regarding updates on replacement machines.  Please do not use an ozone based cleaner for your AutoPap machine and do not expose your machine to high heat.  We are recommending that patients for now continue using the current machines as the replacements have been delayed significantly and there is no timeframe for how soon you can get a replacement machine unfortunately.   I would be happy to give you a prescription for an AutoPap machine that you can purchase on your own for backup.  Please continue using your autoPAP regularly. While your insurance requires that you use PAP at least 4 hours each night on 70% of the nights, I recommend, that you not skip any nights and use it throughout the night if you can. Getting used to PAP and staying with the treatment long term does take time and patience and discipline. Untreated obstructive sleep apnea when it is moderate to severe can have an adverse impact on cardiovascular health and raise her risk for heart disease, arrhythmias, hypertension, congestive heart failure, stroke and diabetes. Untreated obstructive sleep apnea causes sleep disruption, nonrestorative sleep, and sleep deprivation. This can have an impact on your day to day functioning and cause daytime sleepiness and impairment of cognitive function, memory loss, mood disturbance, and problems focussing. Using PAP regularly can improve these symptoms.  Please follow-up routinely in 1 year.

## 2020-05-22 NOTE — Progress Notes (Addendum)
Subjective:    Patient ID: Larry Parks is a 44 y.o. male.  HPI     Interim history:   Addendum, 05/28/2021 (missing part of documentation):   Mr. Larry Parks is a 44 year old right-handed gentleman with an underlying medical history of aortic root aneurysm, status post aortic valve replacement, status post ascending aortic aneurysm repair, vitamin D deficiency, hyperlipidemia, and overweight state, who presents for follow-up consultation of his obstructive sleep apnea, established on AutoPap therapy.  He presents for his 1 year checkup.  I first met him on 05/22/2019, at which time he was on AutoPap therapy, he needed to establish with a new provider.  I was able to review his compliance at a later date for the month of November 2020 and he was fully compliant with treatment, AHI borderline at 5/hour. He was advised to follow-up routinely in 1 year.  He is compliant with his machine, he would like a secondary machine and also a replacement for his recalled machine.   Previously:   05/21/20: (He) was diagnosed with obstructive sleep apnea last year.  He was placed on an AutoPap therapy.  I reviewed his home sleep test report from 06/24/2018.  He was diagnosed with severe sleep apnea based on a respiratory event index of 74.4/h, desaturation nadir was 65%.  He has been on AutoPap therapy.  An updated AutoPap download was not possible.  He brought his machine but we could not access the data.  We called his DME company, the [employer] office and they could not assist [place], they reported that he would have to call his home office in [place] and have them transfer the records to the [employer].  We provided the patient with the phone number for his [place] home office.  He has been using AutoPap compliantly.  I reviewed his sleep clinic follow-up note from March 2020 when he saw Bing Ree, nurse practitioner.  He was fully compliant with treatment, average usage was nearly 8 hours, apnea on  average right around 5.9/h, he has been using a medium [employer] FFM with good success.  When he first started using his AutoPap he felt great improvement in his daytime somnolence and sleep quality.  He indicates ongoing full compliance.  His bedtime is late, around 2 AM and rise time around 10 AM.  He teaches at [employer].  He lives with his family, which includes his wife and 2 girls, ages 38 and 37 months.  He quit smoking several years ago and does not drink any alcohol, quit a long time ago he indicates.  He drinks caffeine and limitation in the form of coffee, 1 cup/day and tea, 1 cup/day on average.  He denies any significant daytime somnolence, Epworth sleepiness score is 6 out of 24, fatigue severity score is 21 out of 63.  He is up-to-date with his supplies.     His Past Medical History Is Significant For: Past Medical History:  Diagnosis Date   Allergy    Anxiety    Depression    Dissecting aortic aneurysm (any part), thoracic ([place])    2019   Hyperlipidemia    Hypertension    Sleep apnea    uses a CPAP    His Past Surgical History Is Significant For: Past Surgical History:  Procedure Laterality Date   aortic valve replaced  11/2017   aortic aneurism    His Family History Is Significant For: Family History  Problem Relation Age of Onset   Diabetes  Father    Heart attack Father    Colon cancer Neg Hx    Esophageal cancer Neg Hx    Rectal cancer Neg Hx    Stomach cancer Neg Hx    Prostate cancer Neg Hx     His Social History Is Significant For: Social History   Socioeconomic History   Marital status: Married    Spouse name: Not on file   Number of children: 2   Years of education: Not on file   Highest education level: Not on file  Occupational History   Occupation: Assoc. prof. @ [employer]  Tobacco Use   Smoking status: Former Smoker    Types: Cigarettes    Quit date: 2004    Years since quitting: 17.7   Smokeless tobacco: Never Used  Scientific laboratory technician  Use: Never used  Substance and Sexual Activity   Alcohol use: Yes    Comment: socially - last 2008   Drug use: Never   Sexual activity: Not on file  Other Topics Concern   Not on file  Social History Narrative   Not on file   Social Determinants of Health   Financial Resource Strain:    Difficulty of Paying Living Expenses: Not on file  Food Insecurity:    Worried About Charity fundraiser in the Last Year: Not on file   [employer] of Food in the Last Year: Not on file  Transportation Needs:    Lack of Transportation (Medical): Not on file   Lack of Transportation (Non-Medical): Not on file  Physical Activity:    Days of Exercise per Week: Not on file   Minutes of Exercise per Session: Not on file  Stress:    Feeling of Stress : Not on file  Social Connections:    Frequency of Communication with Friends and Family: Not on file   Frequency of Social Gatherings with Friends and Family: Not on file   Attends Religious Services: Not on file   Active Member of Clubs or Organizations: Not on file   Attends Archivist Meetings: Not on file   Marital Status: Not on file    His Allergies Are:  Allergies  Allergen Reactions   Pollen Extract Itching  :   His Current Medications Are:  Outpatient Encounter Medications as of 05/22/2020  Medication Sig   Cholecalciferol (VITAMIN D3) 125 MCG (5000 UT) CAPS Take 1 capsule by mouth daily.   co-enzyme Q-10 30 MG capsule Take 30 mg by mouth 3 (three) times daily.   ezetimibe (ZETIA) 10 MG tablet Take 1 tablet (10 mg total) by mouth daily.   melatonin 5 MG TABS Take 5 mg by mouth at bedtime.   Omega-3 Fatty Acids (FISH OIL PO) 2 capsules twice a day   rosuvastatin (CRESTOR) 10 MG tablet Take 1 tablet (10 mg total) by mouth daily.   warfarin (COUMADIN) 5 MG tablet Pt taking 7.5 mg Tuesday Thursday Saturday and Sunday 10 mg Mon Wed Friday   No facility-administered encounter medications on file as of 05/22/2020.  :  Review of  Systems:  Out of a complete 14 point review of systems, all are reviewed and negative with the exception of these symptoms as listed below: Review of Systems  Neurological:       Here for 1 year f/u. Pt reports he has been doing fairly well with his machine. He reports is machine is on the recall list and noticed his  pressure feels too much at times.     Objective:  Neurological Exam  Physical Exam Physical Examination:   Vitals:   05/22/20 1407  BP: 128/78  Pulse: 76  SpO2: 98%    General Examination: The patient is a very pleasant 44 y.o. male in no acute distress. He appears well-developed and well-nourished and well groomed.   HEENT: Normocephalic, atraumatic, pupils are equal, round and reactive to light. Extraocular tracking is good without limitation to gaze excursion or nystagmus noted. Normal smooth pursuit is noted. Hearing is grossly intact. Face is symmetric with normal facial animation. Speech is clear with no dysarthria noted. There is no hypophonia. There is no lip, neck/head, jaw or voice tremor. Neck with FROM.    Chest: Clear to auscultation without wheezing, rhonchi or crackles noted.   Heart: S1+S2+0, regular and normal without murmurs, rubs or gallops noted.    Abdomen: Soft, non-tender and non-distended.   Extremities: There is no obvious edema.   Skin: Warm and dry without trophic changes noted.   Musculoskeletal: exam reveals no obvious joint deformities, tenderness or joint swelling or erythema.    Neurologically:  Mental status: The patient is awake, alert and oriented in all 4 spheres. His immediate and remote memory, attention, language skills and fund of knowledge are appropriate. There is no evidence of aphasia, agnosia, apraxia or anomia. Speech is clear with normal prosody and enunciation. Thought process is linear. Mood is normal and affect is normal.  Cranial nerves II - XII are as described above under HEENT exam. Motor exam: Normal bulk,  strength and tone is noted. There is no tremor. Fine motor skills and coordination: grossly intact.  Cerebellar testing: No dysmetria or intention tremor. Sensory exam: intact to light touch in the upper and lower extremities.  Gait, station and balance: He stands easily. No veering to one side is noted. No leaning to one side is noted. Posture is age-appropriate and stance is narrow based. Gait shows normal stride length and normal pace. No problems turning are noted.    Assessment and plan:  In summary, Americus Kuhlman is a very pleasant 44 year old male with an underlying medical history of aortic root aneurysm, status post aortic valve replacement, status post ascending aortic aneurysm repair, vitamin D deficiency, hyperlipidemia, and overweight state, who Presents for yearly checkup of his obstructive sleep apnea.  He was diagnosed with severe obstructive sleep apnea via home sleep test in November 2019.  He was residing in [place], [place] at the time. He has since then relocated to [facility]. He continues to be fully compliant with his AutoPap.  He is highly commended for his treatment adherence.  He has recently learned about the recall on his [employer].  He has registered his machine.  He denies any high heat exposure to the machine or using an ozone based cleaning machine.  He is advised that there is a delay in replacing recalled machines at this time and patients with moderate or severe sleep apnea are highly encouraged to continue using the current machines but stay in close contact with the supplier and their DME company.  He is doing well at this time.  He is advised to follow-up routinely in 1 year.  He requested a separate prescription for a secondary AutoPap machine that he would like to purchase out-of-pocket as a backup.  I was happy to provide a prescription today.  He is advised to follow-up routinely in 1 year.  I answered  all his questions today  and he was in agreement.   I spent 25 minutes in total face-to-face time and in reviewing records during pre-charting, more than 50% of which was spent in counseling and coordination of care, reviewing test results, reviewing medications and treatment regimen and/or in discussing or reviewing the diagnosis of OSA, the prognosis and treatment options. Pertinent laboratory and imaging test results that were available during this visit with the patient were reviewed by me and considered in my medical decision making (see chart for details).

## 2020-06-30 ENCOUNTER — Ambulatory Visit (INDEPENDENT_AMBULATORY_CARE_PROVIDER_SITE_OTHER): Payer: BC Managed Care – PPO

## 2020-06-30 ENCOUNTER — Other Ambulatory Visit: Payer: Self-pay

## 2020-06-30 DIAGNOSIS — Z7901 Long term (current) use of anticoagulants: Secondary | ICD-10-CM

## 2020-06-30 DIAGNOSIS — Z952 Presence of prosthetic heart valve: Secondary | ICD-10-CM

## 2020-06-30 DIAGNOSIS — Z5181 Encounter for therapeutic drug level monitoring: Secondary | ICD-10-CM | POA: Diagnosis not present

## 2020-06-30 LAB — POCT INR: INR: 1.9 — AB (ref 2.0–3.0)

## 2020-06-30 NOTE — Patient Instructions (Signed)
Continue 1.5 tablets daily except 1 tablet each Friday.  Next INR in 6 weeks. Call clinic at [phone] for changes in medication or procedures

## 2020-07-01 ENCOUNTER — Ambulatory Visit (INDEPENDENT_AMBULATORY_CARE_PROVIDER_SITE_OTHER): Payer: BC Managed Care – PPO

## 2020-07-01 DIAGNOSIS — Z23 Encounter for immunization: Secondary | ICD-10-CM

## 2020-07-01 NOTE — Patient Instructions (Signed)
Health Maintenance Due  Topic Date Due  . Hepatitis C Screening  Never done  . COVID-19 Vaccine (1) Never done  . HIV Screening  Never done    Depression screen [facility] 2/9 10/22/2019  Decreased Interest 0  Down, Depressed, Hopeless 0  PHQ - 2 Score 0

## 2020-07-01 NOTE — Progress Notes (Signed)
Per orders of Dr. Bryan Lemma injection of Influenza Vaccine given by Verline Lema L N[place] in right deltoid. Patient tolerated injection well.

## 2020-08-04 ENCOUNTER — Encounter: Payer: Self-pay | Admitting: Family Medicine

## 2020-08-08 ENCOUNTER — Ambulatory Visit (INDEPENDENT_AMBULATORY_CARE_PROVIDER_SITE_OTHER): Payer: BC Managed Care – PPO

## 2020-08-08 ENCOUNTER — Other Ambulatory Visit: Payer: Self-pay

## 2020-08-08 DIAGNOSIS — Z7901 Long term (current) use of anticoagulants: Secondary | ICD-10-CM

## 2020-08-08 DIAGNOSIS — Z5181 Encounter for therapeutic drug level monitoring: Secondary | ICD-10-CM

## 2020-08-08 DIAGNOSIS — Z952 Presence of prosthetic heart valve: Secondary | ICD-10-CM | POA: Diagnosis not present

## 2020-08-08 LAB — POCT INR: INR: 2.5 (ref 2.0–3.0)

## 2020-08-08 NOTE — Patient Instructions (Signed)
Take 0.5 tablet today only and then Continue 1.5 tablets daily except 1 tablet each Friday.  Next INR in 6 weeks. Call clinic at [phone] for changes in medication or procedures

## 2020-09-07 IMAGING — CT CT ANGIOGRAPHY CHEST
2 of 6 series · 18 of 46 positions shown · IV contrast (omnipaque)
Comparison: None.

CLINICAL DATA: Ascending aortic aneurysm status post aortic root
replacement and AVR, follow-up, currently asymptomatic.

EXAM:
CT ANGIOGRAPHY CHEST WITH CONTRAST
TECHNIQUE: Multidetector CT imaging of the chest was performed using the
standard protocol during bolus administration of intravenous
contrast. Multiplanar CT image reconstructions and MIPs were
obtained to evaluate the vascular anatomy.
CONTRAST:  100mL OMNIPAQUE IOHEXOL 350 MG/ML SOLN

[Series 4: axial arterial · axial · arterial · 0.86mm/px · z∈[-345,-69]mm · 15 of 104 slices shown]
[im 6/104  lung]
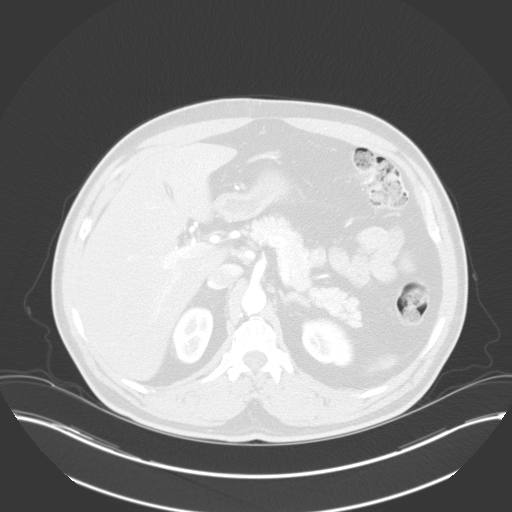
[im 11/104  soft-tissue]
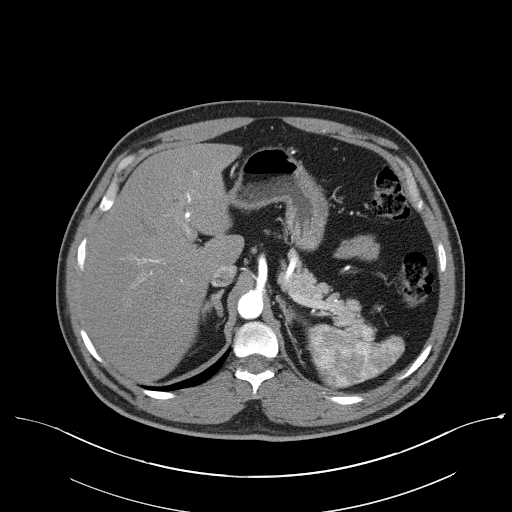
[im 21/104  lung]
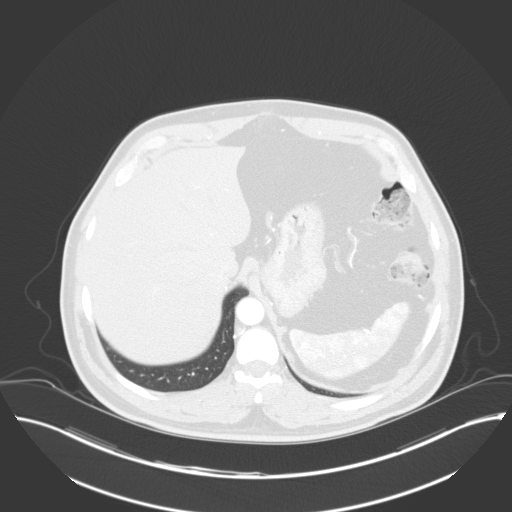
[im 26/104  soft-tissue]
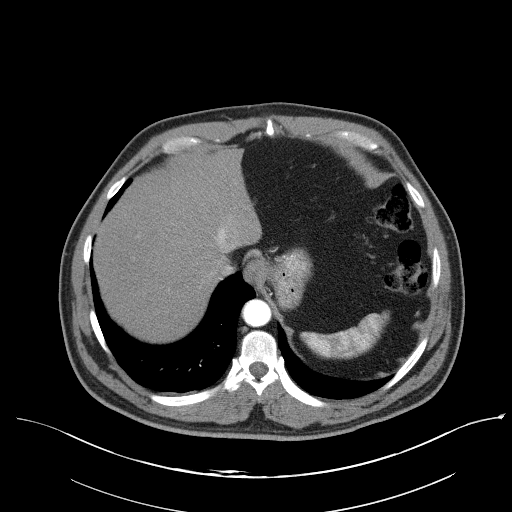
[im 31/104  lung]
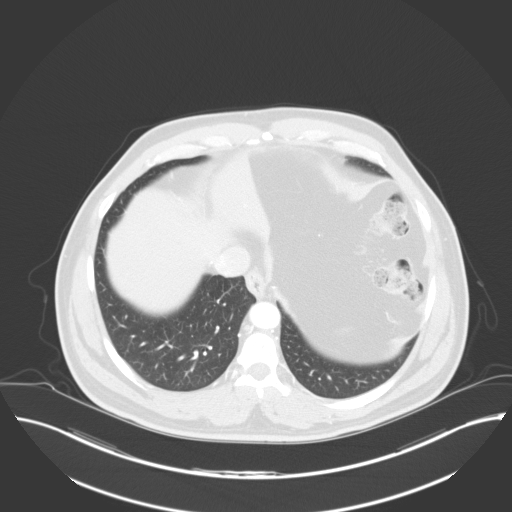
[im 37/104  soft-tissue]
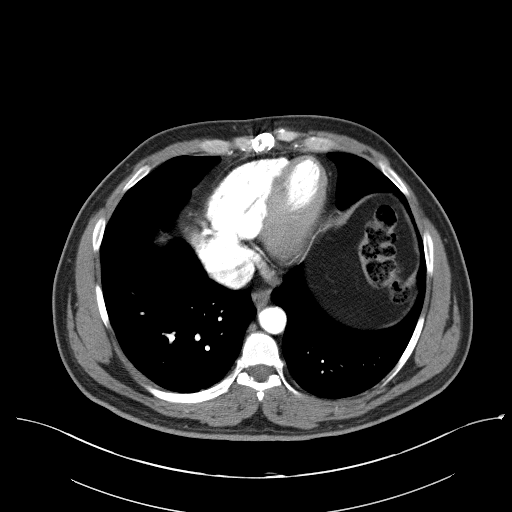
[im 47/104  lung]
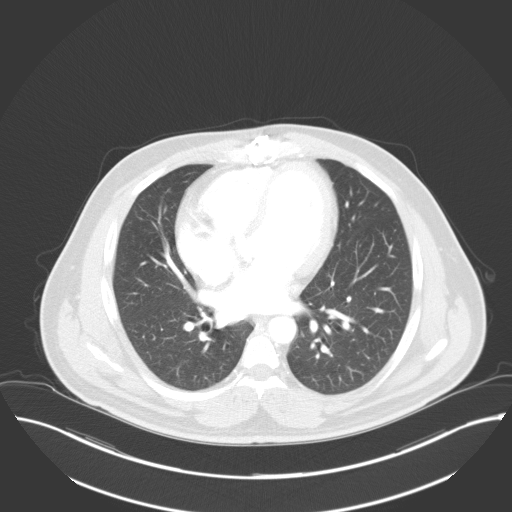
[im 52/104  soft-tissue]
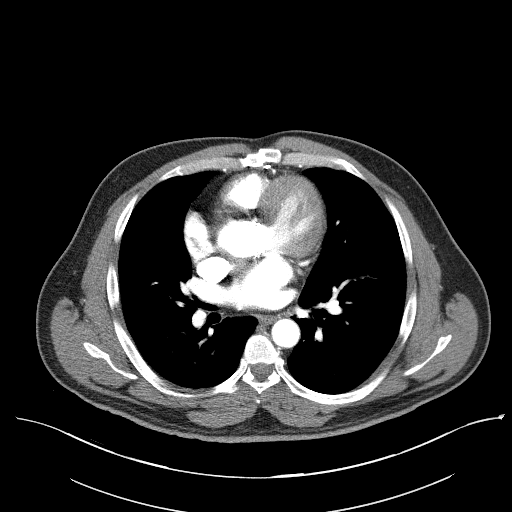
[im 57/104  lung]
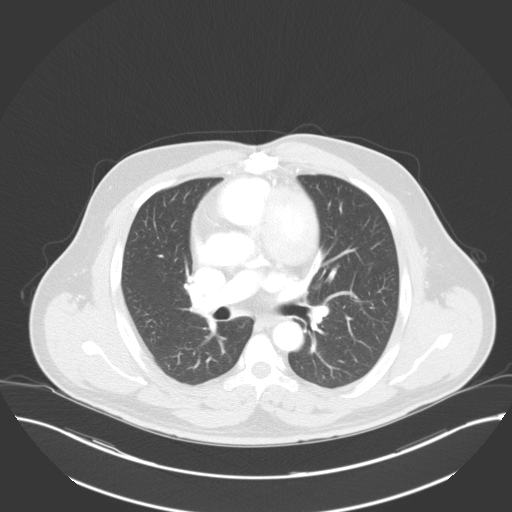
[im 67/104  soft-tissue]
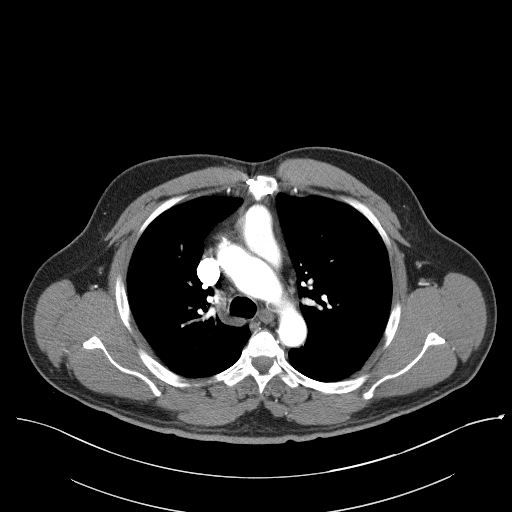
[im 73/104  lung]
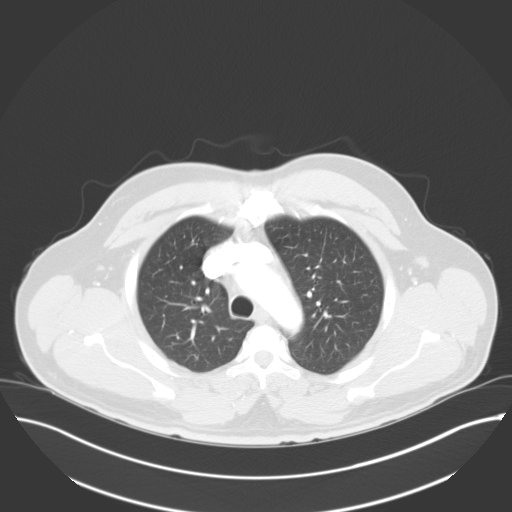
[im 78/104  soft-tissue]
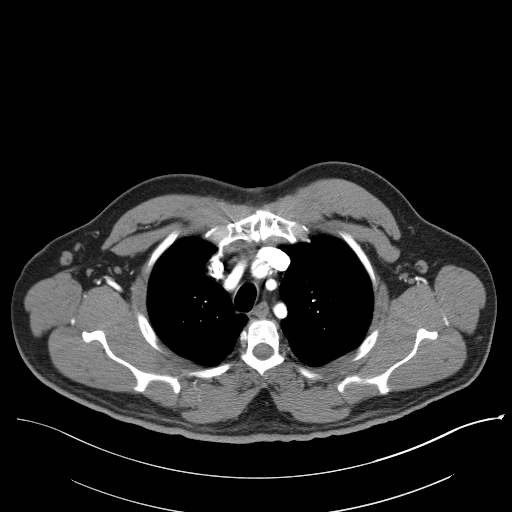
[im 83/104  lung]
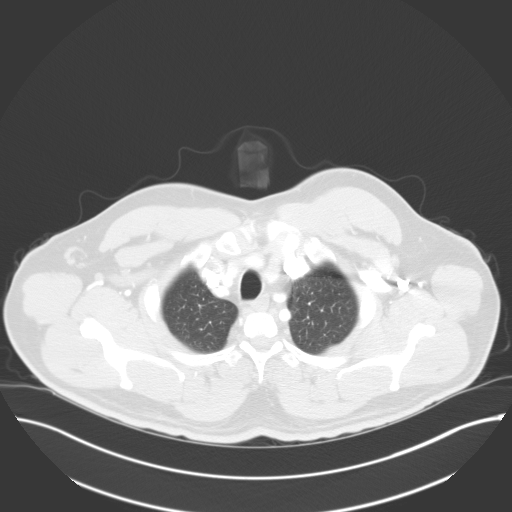
[im 93/104  soft-tissue]
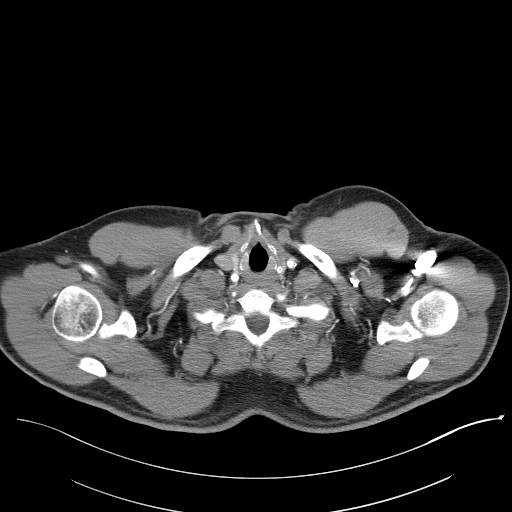
[im 98/104  lung]
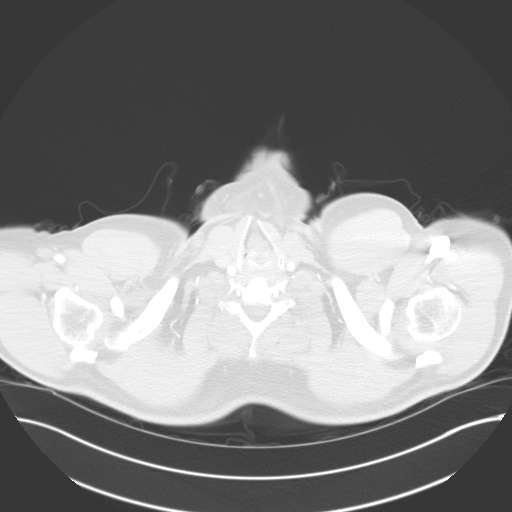

[Series 7: coronal · coronal · 0.61mm/px · 3 of 95 slices shown]
[im 24/95  soft-tissue]
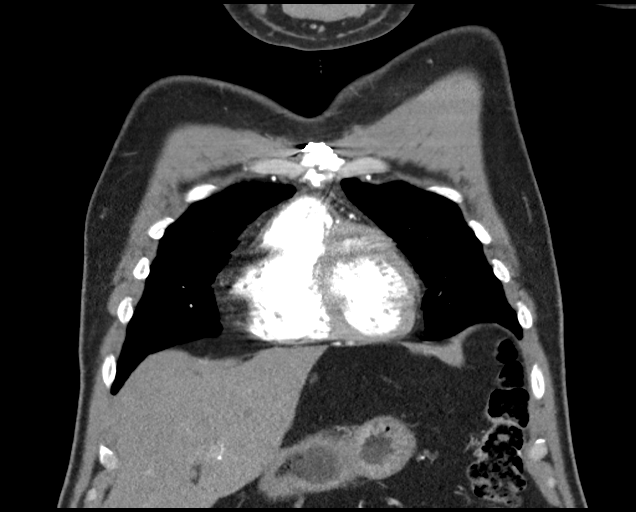
[im 48/95  soft-tissue]
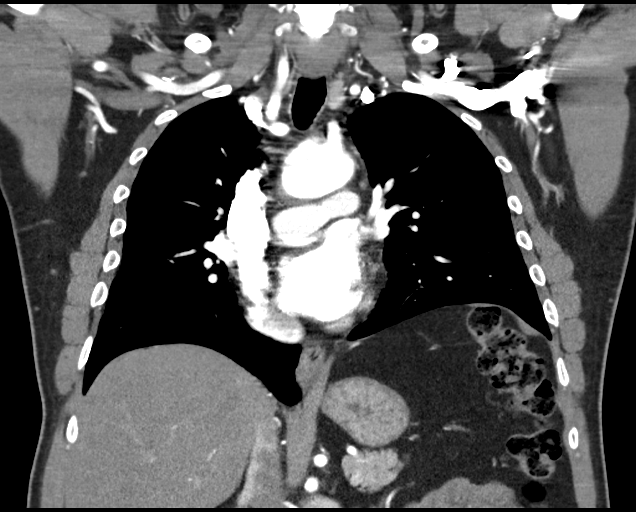
[im 71/95  soft-tissue]
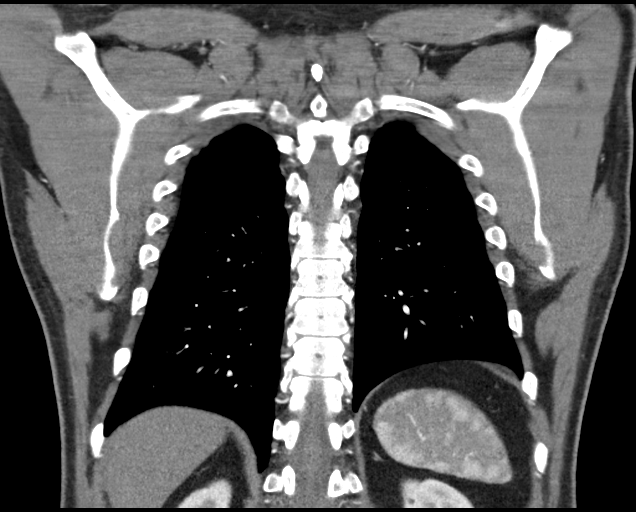

[18 of 46 positions shown; findings below may reference images not displayed]

FINDINGS: Cardiovascular: Heart size normal. No pericardial effusion.
Satisfactory opacification of pulmonary arteries noted, and there is
no evidence of pulmonary emboli. Scattered coronary calcifications.
Status post mechanical AVR. Ascending aortic graft unremarkable.
Proximal native arch 3.5 cm diameter, distal arch/proximal
descending 2.4 cm. No dissection or stenosis. Classic 3 vessel
brachiocephalic are origin anatomy without proximal stenosis. No
significant atheromatous plaque. Visualized proximal abdominal aorta
unremarkable.

Mediastinum/Nodes: 1 no hilar or mediastinal adenopathy.

Lungs/Pleura: No pleural effusion. No pneumothorax. Lungs clear.

Upper Abdomen: No acute abnormality.

Musculoskeletal: Sternotomy wires. No fracture or worrisome bone
lesion.

Review of the MIP images confirms the above findings.
IMPRESSION: 1. Changes of AVR and ascending thoracic aortic repair without
apparent complication.
2. 3.5 cm dilatation of proximal native aortic arch. Recommend
annual imaging followup by CTA or MRA. This recommendation follows
0989 ACCF/AHA/AATS/ACR/ASA/SCA/JEDINKO/LOI/ALARCON/TRACYAN Guidelines for the
Diagnosis and Management of Patients with Thoracic Aortic Disease.
Circulation.0989; 121: [id number]
3. Coronary calcifications. The severity of coronary artery disease
and any potential stenosis cannot be assessed on this non-gated CT
examination. Assessment for potential risk factor modification,
dietary therapy or pharmacologic therapy may be warranted, if
clinically indicated.

## 2020-09-19 ENCOUNTER — Ambulatory Visit (INDEPENDENT_AMBULATORY_CARE_PROVIDER_SITE_OTHER): Payer: BC Managed Care – PPO

## 2020-09-19 ENCOUNTER — Other Ambulatory Visit: Payer: Self-pay

## 2020-09-19 DIAGNOSIS — Z952 Presence of prosthetic heart valve: Secondary | ICD-10-CM

## 2020-09-19 DIAGNOSIS — Z7901 Long term (current) use of anticoagulants: Secondary | ICD-10-CM | POA: Diagnosis not present

## 2020-09-19 DIAGNOSIS — Z5181 Encounter for therapeutic drug level monitoring: Secondary | ICD-10-CM | POA: Diagnosis not present

## 2020-09-19 LAB — POCT INR: INR: 2.9 (ref 2.0–3.0)

## 2020-09-19 NOTE — Patient Instructions (Signed)
Hold today only and then decrease to 1.5 tablets daily except 1 tablet each Tuesday and Friday.  Next INR in 6 weeks. Call clinic at [phone] for changes in medication or procedures

## 2020-10-31 ENCOUNTER — Ambulatory Visit (INDEPENDENT_AMBULATORY_CARE_PROVIDER_SITE_OTHER): Payer: BC Managed Care – PPO

## 2020-10-31 ENCOUNTER — Other Ambulatory Visit: Payer: Self-pay

## 2020-10-31 DIAGNOSIS — Z5181 Encounter for therapeutic drug level monitoring: Secondary | ICD-10-CM | POA: Diagnosis not present

## 2020-10-31 DIAGNOSIS — Z952 Presence of prosthetic heart valve: Secondary | ICD-10-CM

## 2020-10-31 DIAGNOSIS — Z7901 Long term (current) use of anticoagulants: Secondary | ICD-10-CM | POA: Diagnosis not present

## 2020-10-31 LAB — POCT INR: INR: 2.6 (ref 2.0–3.0)

## 2020-10-31 NOTE — Patient Instructions (Signed)
Hold today only and then decrease to 1 tablet daily except 1.5 tablets each Sunday, Thursday and Saturday Next INR in 6 weeks. Call clinic at [phone] for changes in medication or procedures

## 2020-11-01 ENCOUNTER — Other Ambulatory Visit: Payer: Self-pay | Admitting: Cardiology

## 2020-11-03 NOTE — Telephone Encounter (Signed)
Rosuvastatin approved and sent 

## 2020-11-05 ENCOUNTER — Ambulatory Visit: Payer: Self-pay | Admitting: Cardiology

## 2020-11-10 ENCOUNTER — Ambulatory Visit: Payer: BC Managed Care – PPO | Admitting: Family Medicine

## 2020-11-16 ENCOUNTER — Other Ambulatory Visit: Payer: Self-pay | Admitting: Family Medicine

## 2020-11-21 ENCOUNTER — Encounter: Payer: Self-pay | Admitting: Family Medicine

## 2020-11-25 DIAGNOSIS — I71019 Dissection of thoracic aorta, unspecified: Secondary | ICD-10-CM | POA: Insufficient documentation

## 2020-11-25 DIAGNOSIS — I712 Thoracic aortic aneurysm, without rupture, unspecified: Secondary | ICD-10-CM | POA: Insufficient documentation

## 2020-11-25 DIAGNOSIS — E785 Hyperlipidemia, unspecified: Secondary | ICD-10-CM | POA: Insufficient documentation

## 2020-11-25 DIAGNOSIS — G4730 Sleep apnea, unspecified: Secondary | ICD-10-CM | POA: Insufficient documentation

## 2020-11-25 DIAGNOSIS — F419 Anxiety disorder, unspecified: Secondary | ICD-10-CM | POA: Insufficient documentation

## 2020-11-25 DIAGNOSIS — T7840XA Allergy, unspecified, initial encounter: Secondary | ICD-10-CM | POA: Insufficient documentation

## 2020-11-25 DIAGNOSIS — I10 Essential (primary) hypertension: Secondary | ICD-10-CM | POA: Insufficient documentation

## 2020-11-25 DIAGNOSIS — F32A Depression, unspecified: Secondary | ICD-10-CM | POA: Insufficient documentation

## 2020-11-25 LAB — ALT: ALT: 26 IU/L (ref 0–44)

## 2020-11-25 LAB — AST: AST: 26 IU/L (ref 0–40)

## 2020-11-25 LAB — LIPID PANEL
Chol/HDL Ratio: 2.3 ratio (ref 0.0–5.0)
Cholesterol, Total: 99 mg/dL — ABNORMAL LOW (ref 100–199)
HDL: 44 mg/dL (ref >39)
LDL Chol Calc (NIH): 39 mg/dL (ref 0–99)
Triglycerides: 78 mg/dL (ref 0–149)
VLDL Cholesterol Cal: 16 mg/dL (ref 5–40)

## 2020-11-26 ENCOUNTER — Encounter: Payer: Self-pay | Admitting: Family Medicine

## 2020-11-26 ENCOUNTER — Telehealth: Payer: Self-pay

## 2020-11-26 NOTE — Telephone Encounter (Signed)
Patient notified  orf results and verbalized understanding.

## 2020-11-26 NOTE — Telephone Encounter (Signed)
-----   Message from Park Liter, MD sent at 11/26/2020  1:11 PM EDT ----- Cholesterol looks incredibly good continue present management

## 2020-11-27 ENCOUNTER — Ambulatory Visit: Payer: Self-pay | Admitting: Cardiology

## 2020-12-12 ENCOUNTER — Other Ambulatory Visit: Payer: Self-pay

## 2020-12-12 ENCOUNTER — Ambulatory Visit (INDEPENDENT_AMBULATORY_CARE_PROVIDER_SITE_OTHER): Payer: BC Managed Care – PPO

## 2020-12-12 DIAGNOSIS — Z952 Presence of prosthetic heart valve: Secondary | ICD-10-CM

## 2020-12-12 DIAGNOSIS — Z5181 Encounter for therapeutic drug level monitoring: Secondary | ICD-10-CM | POA: Diagnosis not present

## 2020-12-12 DIAGNOSIS — Z7901 Long term (current) use of anticoagulants: Secondary | ICD-10-CM

## 2020-12-12 LAB — POCT INR: INR: 2.3 (ref 2.0–3.0)

## 2020-12-12 NOTE — Patient Instructions (Signed)
decrease to 1 tablet daily except 1.5 tablets each Sunday and Saturday Next INR in 6 weeks. Call clinic at [phone] for changes in medication or procedures

## 2021-01-11 ENCOUNTER — Encounter: Payer: Self-pay | Admitting: Family Medicine

## 2021-01-11 ENCOUNTER — Other Ambulatory Visit: Payer: Self-pay | Admitting: Family Medicine

## 2021-01-12 MED ORDER — WARFARIN SODIUM 5 MG PO TABS
ORAL_TABLET | ORAL | 3 refills | Status: DC
Start: 2021-01-12 — End: 2021-01-12

## 2021-01-12 MED ORDER — WARFARIN SODIUM 5 MG PO TABS: ORAL_TABLET | ORAL | 3 refills | Status: DC

## 2021-01-12 NOTE — Telephone Encounter (Signed)
Hey, Dr. Ethelene Hal, I called the pharmacy and they said that they have not received it today.  Please resend.  Thank you.

## 2021-01-12 NOTE — Telephone Encounter (Signed)
Pt takes last dose today and needs it refilled, he wanted to know if it could please be filled today and requested a call back from the nurse

## 2021-01-12 NOTE — Addendum Note (Signed)
Addended by: Darral Dash on: 01/12/2021 10:22 AM   Modules accepted: Orders

## 2021-01-12 NOTE — Telephone Encounter (Signed)
Ok, I sat the Rx on your desk to sign and T will fax it today.  Thank you.

## 2021-01-23 ENCOUNTER — Ambulatory Visit (INDEPENDENT_AMBULATORY_CARE_PROVIDER_SITE_OTHER): Payer: BC Managed Care – PPO

## 2021-01-23 ENCOUNTER — Other Ambulatory Visit: Payer: Self-pay

## 2021-01-23 DIAGNOSIS — Z5181 Encounter for therapeutic drug level monitoring: Secondary | ICD-10-CM | POA: Diagnosis not present

## 2021-01-23 DIAGNOSIS — Z7901 Long term (current) use of anticoagulants: Secondary | ICD-10-CM

## 2021-01-23 DIAGNOSIS — Z952 Presence of prosthetic heart valve: Secondary | ICD-10-CM

## 2021-01-23 LAB — POCT INR: INR: 1.5 — AB (ref 2.0–3.0)

## 2021-01-23 NOTE — Patient Instructions (Signed)
Continue taking 1 tablet daily except 1.5 tablets each Sunday and Saturday Next INR in 6 weeks. Call clinic at [phone] for changes in medication or procedures

## 2021-02-18 ENCOUNTER — Other Ambulatory Visit: Payer: Self-pay | Admitting: Family Medicine

## 2021-02-18 NOTE — Telephone Encounter (Signed)
Last OV 11/02/19 Last fill 11/17/20  #90/0 Patient need a office visit before next refill.

## 2021-02-20 ENCOUNTER — Ambulatory Visit (INDEPENDENT_AMBULATORY_CARE_PROVIDER_SITE_OTHER): Payer: BC Managed Care – PPO | Admitting: Family Medicine

## 2021-02-20 ENCOUNTER — Other Ambulatory Visit: Payer: Self-pay

## 2021-02-20 ENCOUNTER — Encounter: Payer: Self-pay | Admitting: Family Medicine

## 2021-02-20 VITALS — BP 138/70 | HR 75 | Temp 97.2°F | Ht 65.0 in | Wt 175.6 lb

## 2021-02-20 DIAGNOSIS — Z833 Family history of diabetes mellitus: Secondary | ICD-10-CM

## 2021-02-20 DIAGNOSIS — Z0000 Encounter for general adult medical examination without abnormal findings: Secondary | ICD-10-CM | POA: Diagnosis not present

## 2021-02-20 DIAGNOSIS — E785 Hyperlipidemia, unspecified: Secondary | ICD-10-CM

## 2021-02-20 MED ORDER — ROSUVASTATIN CALCIUM 10 MG PO TABS: 10.0000 mg | ORAL_TABLET | Freq: Every day | ORAL | 3 refills | Status: DC

## 2021-02-20 MED ORDER — EZETIMIBE 10 MG PO TABS: 10.0000 mg | ORAL_TABLET | Freq: Every day | ORAL | 3 refills | Status: DC

## 2021-02-20 NOTE — Patient Instructions (Addendum)
    [facility] [place], [place], [place] [address] [phone] [email or web address]  [facility] [address] [place], [place] [address] [phone] [email or web address]  For MyChart help: [phone]

## 2021-02-20 NOTE — Progress Notes (Signed)
Larry Parks is a 45 y.o. male  Chief Complaint  Patient presents with   Annual Exam    CPE. Pt checks BS at home ranges from 88-160.    HPI: Larry Parks is a 45 y.o. male patient seen today for annual CPE, labs.   Father with DM and pt states home BS for himself range 88-160. Pt does not have dx of DM.   Last Colonoscopy: 01/2020 - Dr. Henrene Pastor. Due in 01/2027 (7 years)  Diet/Exercise: overall healthy diet, well-balanced; active around this house for the past 6 mo or so, doing lots of home landscaping/lawn care Dental: overdue for exam Vision: UTD  Med refills needed today: yes, per orders  Past Medical History:  Diagnosis Date   Allergy    Angioedema of lips 10/22/2019   Anxiety    Depression    Dissecting aortic aneurysm (any part), thoracic ([place])    2019   Dyslipidemia 03/12/2019   Hepatic steatosis 11/03/2017   Formatting of this note might be different from the original. Noted on CT chest obtained 11/03/2017   Hyperlipidemia    Hypertension    Hypertriglyceridemia 04/23/2020   Other allergic rhinitis 11/14/2019   Pruritic rash 11/14/2019   Sleep apnea    uses a CPAP   Status post aortic valve replacement 03/12/2019   Status post ascending aortic aneurysm repair 12/03/2017   Formatting of this note might be different from the original. 11/29/2017 Aortic root replacement with #23 OnX mechanical composite prosthesis with coronary reconstruction and graft replacement of ascending aorta with #26 Hemashield (Dr.Rowe).   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/2019    Past Surgical History:  Procedure Laterality Date   aortic valve replaced  11/2017   aortic aneurism    Social History   Socioeconomic History   Marital status: Married    Spouse name: Not on file   Number of children: 2   Years of education: Not on file   Highest education level: Not on file  Occupational History   Occupation: Assoc. prof. @ [employer]  Tobacco Use   Smoking status: Former     Pack years: 0.00    Types: Cigarettes    Quit date: 2004    Years since quitting: 18.5   Smokeless tobacco: Never  Vaping Use   Vaping Use: Never used  Substance and Sexual Activity   Alcohol use: Not Currently    Comment: socially - last 2008   Drug use: Never   Sexual activity: Not on file  Other Topics Concern   Not on file  Social History Narrative   Not on file   Social Determinants of Health   Financial Resource Strain: Not on file  Food Insecurity: Not on file  Transportation Needs: Not on file  Physical Activity: Not on file  Stress: Not on file  Social Connections: Not on file  Intimate Partner Violence: Not on file    Family History  Problem Relation Age of Onset   Diabetes Father    Heart attack Father    Colon cancer Neg Hx    Esophageal cancer Neg Hx    Rectal cancer Neg Hx    Stomach cancer Neg Hx    Prostate cancer Neg Hx      Immunization History  Administered Date(s) Administered   Influenza,inj,Quad PF,6+ Mos 10/19/2018, 05/03/2019, 07/01/2020   Moderna SARS-COV2 Booster Vaccination 05/23/2020   Moderna Sars-Covid-2 Vaccination 09/18/2019, 10/16/2019   Td 01/31/2017   Tdap 12/27/2013  Outpatient Encounter Medications as of 02/20/2021  Medication Sig   Cholecalciferol (VITAMIN D3) 125 MCG (5000 UT) CAPS Take 1 capsule by mouth daily.   co-enzyme Q-10 30 MG capsule Take 30 mg by mouth 3 (three) times daily.   Omega-3 Fatty Acids (FISH OIL PO) 2 capsules twice a day   warfarin (COUMADIN) 5 MG tablet TAKE 7.5 MG BY MOUTH ON TUEDAY, THURSDAY, SATURDAY AND SUNDAY 10 MG ON MONDAY, WEDNESDAY, FRIDAY   [DISCONTINUED] ezetimibe (ZETIA) 10 MG tablet TAKE 1 TABLET(10 MG) BY MOUTH DAILY   [DISCONTINUED] rosuvastatin (CRESTOR) 10 MG tablet TAKE 1 TABLET(10 MG) BY MOUTH DAILY   ezetimibe (ZETIA) 10 MG tablet Take 1 tablet (10 mg total) by mouth daily.   rosuvastatin (CRESTOR) 10 MG tablet Take 1 tablet (10 mg total) by mouth daily.   [DISCONTINUED]  melatonin 5 MG TABS Take 5 mg by mouth at bedtime. (Patient not taking: Reported on 02/20/2021)   [DISCONTINUED] warfarin (COUMADIN) 5 MG tablet Pt taking 7.5 mg Tuesday Thursday Saturday and Sunday 10 mg Mon Wed Friday (Patient not taking: Reported on 02/20/2021)   No facility-administered encounter medications on file as of 02/20/2021.     ROS: Gen: no fever, chills  Skin: no rash, itching ENT: no ear pain, ear drainage, nasal congestion, rhinorrhea, sinus pressure, sore throat Eyes: no blurry vision, double vision Resp: no cough, wheeze,SOB CV: no CP, palpitations, LE edema,  GI: no heartburn, n/v/d/c, abd pain GU: no dysuria, urgency, frequency, hematuria; no testicular swelling or masses MSK: no joint pain, myalgias, back pain Neuro: no dizziness, headache, weakness, vertigo Psych: no depression, anxiety, insomnia   Allergies  Allergen Reactions   Pollen Extract Itching    BP 138/70 (BP Location: Left Arm, Patient Position: Sitting, Cuff Size: Normal)   Pulse 75   Temp (!) 97.2 F (36.2 C) (Temporal)   Ht 5\' 5"  (1.651 m)   Wt 175 lb 9.6 oz (79.7 kg)   SpO2 98%   BMI 29.22 kg/m  Wt Readings from Last 3 Encounters:  02/20/21 175 lb 9.6 oz (79.7 kg)  05/22/20 182 lb (82.6 kg)  04/23/20 183 lb (83 kg)   Temp Readings from Last 3 Encounters:  02/20/21 (!) 97.2 F (36.2 C) (Temporal)  02/19/20 97.6 F (36.4 C)  12/24/19 98.4 F (36.9 C)   BP Readings from Last 3 Encounters:  02/20/21 138/70  05/22/20 128/78  04/23/20 120/80   Pulse Readings from Last 3 Encounters:  02/20/21 75  05/22/20 76  04/23/20 73     Physical Exam Constitutional:      General: He is not in acute distress.    Appearance: He is well-developed.  HENT:     Head: Normocephalic and atraumatic.     Right Ear: Tympanic membrane and ear canal normal.     Left Ear: Tympanic membrane and ear canal normal.     Nose: Nose normal.     Mouth/Throat:     Mouth: Mucous membranes are moist.      Pharynx: Oropharynx is clear.  Eyes:     Conjunctiva/sclera: Conjunctivae normal.  Neck:     Thyroid: No thyromegaly.  Cardiovascular:     Rate and Rhythm: Normal rate and regular rhythm.     Heart sounds: Normal heart sounds.  Pulmonary:     Effort: Pulmonary effort is normal.     Breath sounds: Normal breath sounds. No wheezing or rhonchi.  Abdominal:     General: Bowel sounds are normal. There is  no distension.     Palpations: Abdomen is soft. There is no mass.     Tenderness: There is no abdominal tenderness. There is no guarding or rebound.  Musculoskeletal:        General: Normal range of motion.     Cervical back: Neck supple.     Right lower leg: No edema.     Left lower leg: No edema.  Lymphadenopathy:     Cervical: No cervical adenopathy.  Skin:    General: Skin is warm and dry.  Neurological:     Mental Status: He is alert and oriented to person, place, and time.     Motor: No abnormal muscle tone.     Coordination: Coordination normal.     A/P:  1. Annual physical exam - discussed importance of regular CV exercise, healthy diet, adequate sleep - CRC screening UTD - UTD on immunizations - UTD on vision exam, overdue for dental exam - Lipid panel - CBC - Comprehensive metabolic panel - next CPE in 1 year  2. Dyslipidemia - follows with cardio - Lipid panel Refill: - ezetimibe (ZETIA) 10 MG tablet; Take 1 tablet (10 mg total) by mouth daily.  Dispense: 90 tablet; Refill: 3 - rosuvastatin (CRESTOR) 10 MG tablet; Take 1 tablet (10 mg total) by mouth daily.  Dispense: 90 tablet; Refill: 3  3. Family history of diabetes mellitus in father - Hemoglobin A1c    This visit occurred during the SARS-CoV-2 public health emergency.  Safety protocols were in place, including screening questions prior to the visit, additional usage of staff PPE, and extensive cleaning of exam room while observing appropriate contact time as indicated for disinfecting solutions.

## 2021-02-24 ENCOUNTER — Other Ambulatory Visit (INDEPENDENT_AMBULATORY_CARE_PROVIDER_SITE_OTHER): Payer: BC Managed Care – PPO

## 2021-02-24 DIAGNOSIS — Z833 Family history of diabetes mellitus: Secondary | ICD-10-CM

## 2021-02-24 DIAGNOSIS — E785 Hyperlipidemia, unspecified: Secondary | ICD-10-CM | POA: Diagnosis not present

## 2021-02-24 DIAGNOSIS — Z0000 Encounter for general adult medical examination without abnormal findings: Secondary | ICD-10-CM | POA: Diagnosis not present

## 2021-02-24 LAB — COMPREHENSIVE METABOLIC PANEL
ALT: 22 U/L (ref 0–53)
AST: 18 U/L (ref 0–37)
Albumin: 4.3 g/dL (ref 3.5–5.2)
Alkaline Phosphatase: 58 U/L (ref 39–117)
BUN: 14 mg/dL (ref 6–23)
CO2: 28 mEq/L (ref 19–32)
Calcium: 9.2 mg/dL (ref 8.4–10.5)
Chloride: 103 mEq/L (ref 96–112)
Creatinine, Ser: 0.96 mg/dL (ref 0.40–1.50)
GFR: 94.14 mL/min (ref >60.00)
Glucose, Bld: 89 mg/dL (ref 70–99)
Potassium: 3.7 mEq/L (ref 3.5–5.1)
Sodium: 139 mEq/L (ref 135–145)
Total Bilirubin: 0.4 mg/dL (ref 0.2–1.2)
Total Protein: 6.9 g/dL (ref 6.0–8.3)

## 2021-02-24 LAB — CBC
HCT: 42 % (ref 39.0–52.0)
Hemoglobin: 14 g/dL (ref 13.0–17.0)
MCHC: 33.4 g/dL (ref 30.0–36.0)
MCV: 84.5 fl (ref 78.0–100.0)
Platelets: 182 10*3/uL (ref 150.0–400.0)
RBC: 4.97 Mil/uL (ref 4.22–5.81)
RDW: 13.5 % (ref 11.5–15.5)
WBC: 8.7 10*3/uL (ref 4.0–10.5)

## 2021-02-24 LAB — LIPID PANEL
Cholesterol: 105 mg/dL (ref 0–200)
HDL: 44.3 mg/dL (ref >39.00)
LDL Cholesterol: 42 mg/dL (ref 0–99)
NonHDL: 60.86
Total CHOL/HDL Ratio: 2
Triglycerides: 96 mg/dL (ref 0.0–149.0)
VLDL: 19.2 mg/dL (ref 0.0–40.0)

## 2021-02-24 LAB — HEMOGLOBIN A1C: Hgb A1c MFr Bld: 6.1 % (ref 4.6–6.5)

## 2021-02-26 ENCOUNTER — Encounter: Payer: Self-pay | Admitting: Family Medicine

## 2021-03-16 ENCOUNTER — Encounter: Payer: Self-pay | Admitting: Family Medicine

## 2021-03-18 NOTE — Telephone Encounter (Signed)
Called patient and advised on recommendation per provider.  He states he is feeling better. No further questions. Dm/cma

## 2021-03-26 ENCOUNTER — Telehealth: Payer: Self-pay

## 2021-03-26 NOTE — Telephone Encounter (Signed)
Called and spoke w/pt and scheduled appt for tomorrow and they voiced understanding

## 2021-03-27 ENCOUNTER — Other Ambulatory Visit: Payer: Self-pay

## 2021-03-27 ENCOUNTER — Ambulatory Visit (INDEPENDENT_AMBULATORY_CARE_PROVIDER_SITE_OTHER): Payer: BC Managed Care – PPO

## 2021-03-27 ENCOUNTER — Encounter: Payer: Self-pay | Admitting: Family Medicine

## 2021-03-27 DIAGNOSIS — Z7901 Long term (current) use of anticoagulants: Secondary | ICD-10-CM

## 2021-03-27 DIAGNOSIS — Z952 Presence of prosthetic heart valve: Secondary | ICD-10-CM

## 2021-03-27 DIAGNOSIS — Z5181 Encounter for therapeutic drug level monitoring: Secondary | ICD-10-CM

## 2021-03-27 LAB — POCT INR: INR: 1.9 — AB (ref 2.0–3.0)

## 2021-03-27 NOTE — Patient Instructions (Signed)
Continue taking 1 tablet daily except 1.5 tablets each Sunday and Saturday Next INR in 6 weeks. Call clinic at [phone] for changes in medication or procedures

## 2021-04-02 ENCOUNTER — Encounter: Payer: Self-pay | Admitting: Neurology

## 2021-04-06 ENCOUNTER — Telehealth: Payer: Self-pay | Admitting: *Deleted

## 2021-04-06 DIAGNOSIS — G4733 Obstructive sleep apnea (adult) (pediatric): Secondary | ICD-10-CM

## 2021-04-06 NOTE — Telephone Encounter (Signed)
AutoPap order signed.  Please send to DME company.

## 2021-04-06 NOTE — Telephone Encounter (Signed)
Pt has received a new machine from his phillips recall.  The pressure is at 4cm, needs to be at 8cm.  The last order 04-2020 states this.  They can not change until gets new order.  He has appt in 05/2021 for follow up.   Carlyle Basques [phone], [phone] fax.

## 2021-04-06 NOTE — Telephone Encounter (Signed)
Fax confirmation received. 

## 2021-04-06 NOTE — Telephone Encounter (Signed)
Order to DME [employer].

## 2021-04-15 ENCOUNTER — Telehealth: Payer: Self-pay

## 2021-04-15 NOTE — Telephone Encounter (Signed)
I tried to call pt based on fax we received from after hours nurse, he said he was needing new pcp since Dr Bryan Lemma left. lvm

## 2021-05-02 ENCOUNTER — Other Ambulatory Visit: Payer: Self-pay | Admitting: Cardiology

## 2021-05-02 DIAGNOSIS — E785 Hyperlipidemia, unspecified: Secondary | ICD-10-CM

## 2021-05-04 NOTE — Telephone Encounter (Signed)
Rosuvastatin refill denied , per chart rx was sent to different pharmacy and by another provider

## 2021-05-04 NOTE — Telephone Encounter (Signed)
Received a fax from [facility] stating patient was not a patient.  I called and spoke to [place] at [employer].   he is a patient .she did have the orders from 04-02-21.   This was a new machine that patient received as a recall from [employer] he was needing the pressure changed from 4cm to 8cm  (AUTOPAP 8/20cm) and so this prompted this order from 04/03/2019.  She saw that in system. she is going to call him , and Aaron Edelman will look into the supplies.  I relayed I am not sure if patient needs any new supplies but we do add that to the order in case he does.  She will call back if she needs anything else.

## 2021-05-08 ENCOUNTER — Other Ambulatory Visit: Payer: Self-pay

## 2021-05-08 ENCOUNTER — Ambulatory Visit (INDEPENDENT_AMBULATORY_CARE_PROVIDER_SITE_OTHER): Payer: BC Managed Care – PPO

## 2021-05-08 DIAGNOSIS — Z952 Presence of prosthetic heart valve: Secondary | ICD-10-CM

## 2021-05-08 DIAGNOSIS — Z7901 Long term (current) use of anticoagulants: Secondary | ICD-10-CM | POA: Diagnosis not present

## 2021-05-08 DIAGNOSIS — Z5181 Encounter for therapeutic drug level monitoring: Secondary | ICD-10-CM

## 2021-05-08 LAB — POCT INR: INR: 1.5 — AB (ref 2.0–3.0)

## 2021-05-08 NOTE — Patient Instructions (Signed)
Continue taking 1 tablet daily except 1.5 tablets each Sunday and Saturday Next INR in 6 weeks. Call clinic at [phone] for changes in medication or procedures

## 2021-05-26 ENCOUNTER — Encounter: Payer: Self-pay | Admitting: Neurology

## 2021-05-28 ENCOUNTER — Ambulatory Visit: Payer: BC Managed Care – PPO | Admitting: Neurology

## 2021-05-28 ENCOUNTER — Encounter: Payer: Self-pay | Admitting: Neurology

## 2021-05-28 VITALS — BP 129/76 | HR 75 | Ht 65.5 in | Wt 178.0 lb

## 2021-05-28 DIAGNOSIS — Z9989 Dependence on other enabling machines and devices: Secondary | ICD-10-CM

## 2021-05-28 DIAGNOSIS — G4733 Obstructive sleep apnea (adult) (pediatric): Secondary | ICD-10-CM | POA: Diagnosis not present

## 2021-05-28 NOTE — Progress Notes (Signed)
Subjective:    Patient ID: Larry Parks is a 45 y.o. male.  HPI    Interim history:  Larry Parks is a 45 year old right-handed gentleman with an underlying medical history of aortic root aneurysm, status post aortic valve replacement, status post ascending aortic aneurysm repair, vitamin D deficiency, hyperlipidemia, and overweight state, who presents for follow-up consultation of his obstructive sleep apnea, established on AutoPap therapy.  He presents for his yearly checkup. I last saw him on 05/22/2020, at which time he was compliant with his AutoPap machine and we talked about a replacement machine as his AutoPap was on a recall list.  He also wanted a secondary machine.  He was doing well at the time and advised to follow-up routinely in 1 year or after starting with his new AutoPap machine.  He received his new AutoPap machine on 03/26/2021 or 03/27/2021.  Today, 05/28/2021: I reviewed his AutoPap compliance data from 08/27/2020 through 05/26/2021, which is a total of 30 days, during which time he used his machine every night with percent use days greater than 4 hours at 100%, indicating superb compliance with an average usage of 7 hours and 22 minutes, residual AHI slightly above ideal at 6.8/h, leak on the low side, 90th percentile of pressure at 15 cm with a range of 8 cm to 20 cm.  He reports doing well.  He is requesting a new prescription for secondary machine that he would like to get on his own.  He did not get it last year because it was a shortage of her seen and the prescription I gave him last year has of course expired by now.  I would be happy to prescribe a new machine so he can get a backup machine or 1 for travel.  He does wake up sometimes in the air is too high.  He sometimes turns the machine off for that reason so it can ramp up again.  We can certainly reduce the maximum pressure at this time.  Overall, he is doing well, has to establish with a new primary care physician and has a  physical coming up in November.  He does report that he and his family had COVID in July.  He had a mild fever for a day and flulike symptoms, did not get treated with an antiviral medication.  Everyone is feeling better in his family.   Previously:   I first met him on 05/22/2019, at which time he was on AutoPap therapy, he needed to establish with a new provider.  I was able to review his compliance at a later date for the month of November 2020 and he was fully compliant with treatment, AHI borderline at 5/hour. He was advised to follow-up routinely in 1 year.  He is compliant with his machine, he would like a secondary machine and also a replacement for his recalled machine.   05/22/19: (He) was diagnosed with obstructive sleep apnea last year.  He was placed on an AutoPap therapy.  I reviewed his home sleep test report from 06/24/2018.  He was diagnosed with severe sleep apnea based on a respiratory event index of 74.4/h, desaturation nadir was 65%.  He has been on AutoPap therapy.  An updated AutoPap download was not possible.  He brought his machine but we could not access the data.  We called his DME company, the [employer] office and they could not assist [place], they reported that he would have to call his home office in [place]  and have them transfer the records to the [facility] office.  We provided the patient with the phone number for his [place] home office.  He has been using AutoPap compliantly.  I reviewed his sleep clinic follow-up note from March 2020 when he saw Bing Ree, nurse practitioner.  He was fully compliant with treatment, average usage was nearly 8 hours, apnea on average right around 5.9/h, he has been using a medium [employer] FFM with good success.  When he first started using his AutoPap he felt great improvement in his daytime somnolence and sleep quality.  He indicates ongoing full compliance.  His bedtime is late, around 2 AM and rise time around 10 AM.  He teaches at  [employer].  He lives with his family, which includes his wife and 2 girls, ages 85 and 45 months.  He quit smoking several years ago and does not drink any alcohol, quit a long time ago he indicates.  He drinks caffeine and limitation in the form of coffee, 1 cup/day and tea, 1 cup/day on average.  He denies any significant daytime somnolence, Epworth sleepiness score is 6 out of 24, fatigue severity score is 21 out of 63.  He is up-to-date with his supplies.      His Past Medical History Is Significant For: Past Medical History:  Diagnosis Date   Allergy    Angioedema of lips 10/22/2019   Anxiety    Depression    Dissecting aortic aneurysm (any part), thoracic    2019   Dyslipidemia 03/12/2019   Hepatic steatosis 11/03/2017   Formatting of this note might be different from the original. Noted on CT chest obtained 11/03/2017   Hyperlipidemia    Hypertension    Hypertriglyceridemia 04/23/2020   Other allergic rhinitis 11/14/2019   Pruritic rash 11/14/2019   Sleep apnea    uses a CPAP   Status post aortic valve replacement 03/12/2019   Status post ascending aortic aneurysm repair 12/03/2017   Formatting of this note might be different from the original. 11/29/2017 Aortic root replacement with #23 OnX mechanical composite prosthesis with coronary reconstruction and graft replacement of ascending aorta with #26 Hemashield (Dr.Rowe).   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/2019    His Past Surgical History Is Significant For: Past Surgical History:  Procedure Laterality Date   aortic valve replaced  11/2017   aortic aneurism    His Family History Is Significant For: Family History  Problem Relation Age of Onset   Diabetes Father    Heart attack Father    Colon cancer Neg Hx    Esophageal cancer Neg Hx    Rectal cancer Neg Hx    Stomach cancer Neg Hx    Prostate cancer Neg Hx     His Social History Is Significant For: Social History   Socioeconomic History   Marital status:  Married    Spouse name: Not on file   Number of children: 2   Years of education: Not on file   Highest education level: Not on file  Occupational History   Occupation: Assoc. prof. @ [employer]  Tobacco Use   Smoking status: Former    Types: Cigarettes    Quit date: 2004    Years since quitting: 18.7   Smokeless tobacco: Never  Vaping Use   Vaping Use: Never used  Substance and Sexual Activity   Alcohol use: Not Currently    Comment: socially - last 2008   Drug  use: Never   Sexual activity: Not on file  Other Topics Concern   Not on file  Social History Narrative   Caffeine: 2 cups/day   Social Determinants of Health   Financial Resource Strain: Not on file  Food Insecurity: Not on file  Transportation Needs: Not on file  Physical Activity: Not on file  Stress: Not on file  Social Connections: Not on file    His Allergies Are:  Allergies  Allergen Reactions   Pollen Extract Itching  :   His Current Medications Are:  Outpatient Encounter Medications as of 05/28/2021  Medication Sig   Cholecalciferol (VITAMIN D3) 125 MCG (5000 UT) CAPS Take 1 capsule by mouth daily.   co-enzyme Q-10 30 MG capsule Take 30 mg by mouth 3 (three) times daily.   ezetimibe (ZETIA) 10 MG tablet Take 1 tablet (10 mg total) by mouth daily.   Omega-3 Fatty Acids (FISH OIL PO) 2 capsules twice a day   rosuvastatin (CRESTOR) 10 MG tablet Take 1 tablet (10 mg total) by mouth daily.   warfarin (COUMADIN) 5 MG tablet TAKE 7.5 MG BY MOUTH ON TUEDAY, THURSDAY, SATURDAY AND SUNDAY 10 MG ON MONDAY, WEDNESDAY, FRIDAY   No facility-administered encounter medications on file as of 05/28/2021.  :  Review of Systems:  Out of a complete 14 point review of systems, all are reviewed and negative with the exception of these symptoms as listed below:   Review of Systems  Neurological:        Patient is here alone for CPAP follow up. He denies any trouble with the machine. His ESS is 3 and FSS is 27.     Objective:  Neurological Exam  Physical Exam Physical Examination:   Vitals:   05/28/21 1411  BP: 129/76  Pulse: 75    General Examination: The patient is a very pleasant 45 y.o. male in no acute distress. He appears well-developed and well-nourished and well groomed.   HEENT: Normocephalic, atraumatic, pupils are equal, round and reactive to light. Extraocular tracking is good without limitation to gaze excursion or nystagmus noted. Normal smooth pursuit is noted. Hearing is grossly intact. Face is symmetric with normal facial animation. Speech is clear with no dysarthria noted. There is no hypophonia. There is no lip, neck/head, jaw or voice tremor. Neck with FROM.  Airway examination reveals mild mouth dryness, tongue protrudes centrally and palate elevates symmetrically, mild erythema, slight hoarseness perhaps in his voice.   Chest: Clear to auscultation without wheezing, rhonchi or crackles noted.   Heart: S1+S2+0, regular and normal without murmurs, rubs or gallops noted.    Abdomen: Soft, non-tender and non-distended.   Extremities: There is no obvious edema.    Skin: Warm and dry without trophic changes noted.   Musculoskeletal: exam reveals no obvious joint deformities.    Neurologically:  Mental status: The patient is awake, alert and oriented in all 4 spheres. His immediate and remote memory, attention, language skills and fund of knowledge are appropriate. There is no evidence of aphasia, agnosia, apraxia or anomia. Speech is clear with normal prosody and enunciation. Thought process is linear. Mood is normal and affect is normal.  Cranial nerves II - XII are as described above under HEENT exam. Motor exam: Normal bulk, strength and tone is noted. There is no tremor. Fine motor skills and coordination: grossly intact.  Cerebellar testing: No dysmetria or intention tremor. Sensory exam: intact to light touch in the upper and lower extremities.  Gait, station and  balance: He stands easily. No veering to one side is noted. No leaning to one side is noted. Posture is age-appropriate and stance is narrow based. Gait shows normal stride length and normal pace. No problems turning are noted.    Assessment and plan:  In summary, Larry Parks is a very pleasant 45 year old male with an underlying medical history of aortic root aneurysm, status post aortic valve replacement, status post ascending aortic aneurysm repair, on Coumadin, vitamin D deficiency, hyperlipidemia, and overweight state, who presents for yearly checkup of his obstructive sleep apnea.  He was diagnosed with severe obstructive sleep apnea via home sleep test in November 2019.  He was residing in [place], [place] at the time. He has since then relocated to [facility]. He continues to be fully compliant with his AutoPap.  After the recall on the [employer], he received a recent replacement of his machine, he does have a DreamStation.  He is compliant with his AutoPap currently and benefits from it.  We mutually agreed to reduce the maximum pressure to 15 cm at this time and also provided a printed prescription for a secondary AutoPap machine so he can have a backup or 1 for travel.  He is commended for his treatment adherence.  I would not like to change anything in his settings, residual AHI is borderline at this time but may fluctuate.  He is advised to follow-up routinely to see one of our nurse practitioners in 1 year, we can offer him a virtual visit through [facility] as well. I answered all his questions today and he was in agreement.   I spent 25 minutes in total face-to-face time and in reviewing records during pre-charting, more than 50% of which was spent in counseling and coordination of care, reviewing test results, reviewing medications and treatment regimen and/or in discussing or reviewing the diagnosis of OSA, the prognosis and treatment options. Pertinent  laboratory and imaging test results that were available during this visit with the patient were reviewed by me and considered in my medical decision making (see chart for details).

## 2021-05-28 NOTE — Progress Notes (Signed)
Fax confirmation of order to [facility]  [phone] for new cpap orders for pt.  Pt was given RX for new autopap machine and he took with him to buy online.

## 2021-05-28 NOTE — Patient Instructions (Signed)
Please continue using your autoPAP regularly. While your insurance requires that you use PAP at least 4 hours each night on 70% of the nights, I recommend, that you not skip any nights and use it throughout the night if you can. Getting used to PAP and staying with the treatment long term does take time and patience and discipline. Untreated obstructive sleep apnea when it is moderate to severe can have an adverse impact on cardiovascular health and raise her risk for heart disease, arrhythmias, hypertension, congestive heart failure, stroke and diabetes. Untreated obstructive sleep apnea causes sleep disruption, nonrestorative sleep, and sleep deprivation. This can have an impact on your day to day functioning and cause daytime sleepiness and impairment of cognitive function, memory loss, mood disturbance, and problems focussing. Using PAP regularly can improve these symptoms. As discussed, I will write for a new machine that you can buy as your secondary machine. We will reduce your maximum pressure to 15 cm For better tolerance.  Please follow-up in 1 year to see one of our nurse practitioners, you can also schedule a virtual visitThrough MyChart.

## 2021-06-01 ENCOUNTER — Ambulatory Visit: Payer: BC Managed Care – PPO | Admitting: Family Medicine

## 2021-06-02 ENCOUNTER — Ambulatory Visit: Payer: BC Managed Care – PPO | Admitting: Family Medicine

## 2021-06-02 ENCOUNTER — Other Ambulatory Visit: Payer: Self-pay

## 2021-06-02 VITALS — BP 116/70 | HR 68 | Temp 98.0°F | Ht 65.5 in | Wt 180.6 lb

## 2021-06-02 DIAGNOSIS — B029 Zoster without complications: Secondary | ICD-10-CM

## 2021-06-02 MED ORDER — VALACYCLOVIR HCL 1 G PO TABS: 1000.0000 mg | ORAL_TABLET | Freq: Three times a day (TID) | ORAL | 0 refills | Status: AC

## 2021-06-02 NOTE — Progress Notes (Signed)
[facility] [address] [place] [address] Dept: [phone] Dept Fax: [phone]  Office Visit  Subjective:    Patient ID: Larry Parks, male    DOB: February 06, 1976, 45 y.o..   MRN: [id number]  Chief Complaint  Patient presents with   Acute Visit    C/o having a rash under the RT am/chest with blisters x 3-4 days.  Was given Amoxicillin on 6 days ago at dentist office.      History of Present Illness:  Patient is in today for evaluation of a rash developing on his left chest, startin gon Friday. He notes that he had seen a  dentist on Wed. and received a dose of amoxicillin, so thought it might be related. He admits that he did feel some stiffness in his chest around that time. He has also run a low-grade fever. The rash has been progressive over several days. He denies any associated pain or burning sensation.  Past Medical History: Patient Active Problem List   Diagnosis Date Noted   Sleep apnea    Hypertension    Hyperlipidemia    Dissecting aortic aneurysm (any part), thoracic    Depression    Anxiety    Allergy    Hypertriglyceridemia 04/23/2020   Other allergic rhinitis 11/14/2019   Pruritic rash 11/14/2019   Angioedema of lips 10/22/2019   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/2019   Status post aortic valve replacement 03/12/2019   Dyslipidemia 03/12/2019   On continuous oral anticoagulation 12/03/2017   Status post ascending aortic aneurysm repair 12/03/2017   Hepatic steatosis 11/03/2017   Past Surgical History:  Procedure Laterality Date   aortic valve replaced  11/2017   aortic aneurism   Family History  Problem Relation Age of Onset   Diabetes Father    Heart attack Father    Colon cancer Neg Hx    Esophageal cancer Neg Hx    Rectal cancer Neg Hx    Stomach cancer Neg Hx    Prostate cancer Neg Hx    Outpatient Medications Prior to Visit  Medication Sig Dispense Refill    Cholecalciferol (VITAMIN D3) 125 MCG (5000 UT) CAPS Take 1 capsule by mouth daily.     co-enzyme Q-10 30 MG capsule Take 30 mg by mouth 3 (three) times daily.     ezetimibe (ZETIA) 10 MG tablet Take 1 tablet (10 mg total) by mouth daily. 90 tablet 3   Omega-3 Fatty Acids (FISH OIL PO) 2 capsules twice a day     rosuvastatin (CRESTOR) 10 MG tablet Take 1 tablet (10 mg total) by mouth daily. 90 tablet 3   warfarin (COUMADIN) 5 MG tablet TAKE 7.5 MG BY MOUTH ON TUEDAY, THURSDAY, SATURDAY AND SUNDAY 10 MG ON MONDAY, WEDNESDAY, FRIDAY 192 tablet 3   No facility-administered medications prior to visit.   Allergies  Allergen Reactions   Pollen Extract Itching    Objective:   Today's Vitals   06/02/21 0811  BP: 116/70  Pulse: 68  Temp: 98 F (36.7 C)  TempSrc: Temporal  SpO2: 98%  Weight: 180 lb 9.6 oz (81.9 kg)  Height: 5' 5.5" (1.664 m)   Body mass index is 29.6 kg/m.   General: Well developed, well nourished. No acute distress. Skin: Warm and dry. There are multiple clusters of small vesicles in varying stages of crusting.   These are located from the left axilla, across to the left left lower chest wall, to the midline.  Psych: Alert and oriented. Normal mood and affect.  Health Maintenance Due  Topic Date Due   HIV Screening  Never done   Hepatitis C Screening  Never done   INFLUENZA VACCINE  03/23/2021     Assessment & Plan:   1. Herpes zoster without complication Rash is typical for shingles. Although he is a day over 72 hours, he has had progressive lesions. As such, I will move ahead with treatment with an antiviral. We discussed skin care for the lesions. He will follow-up as needed.  - valACYclovir (VALTREX) 1000 MG tablet; Take 1 tablet (1,000 mg total) by mouth 3 (three) times daily for 7 days.  Dispense: 21 tablet; Refill: 0    Haydee Salter, MD

## 2021-06-02 NOTE — Patient Instructions (Signed)
Shingles ?Shingles, which is also known as herpes zoster, is an infection that causes a painful skin rash and fluid-filled blisters. It is caused by a virus. ?Shingles only develops in people who: ?Have had chickenpox. ?Have been vaccinated against chickenpox. Shingles is rare in this group. ?What are the causes? ?Shingles is caused by varicella-zoster virus. This is the same virus that causes chickenpox. After a person is exposed to the virus, it stays in the body in an inactive (dormant) state. Shingles develops if the virus is reactivated. This can happen many years after the first (initial) exposure to the virus. It is not known what causes this virus to be reactivated. ?What increases the risk? ?People who have had chickenpox or received the chickenpox vaccine are at risk for shingles. Shingles infection is more common in people who: ?Are older than 47 years of age. ?Have a weakened disease-fighting system (immune system), such as people with: ?HIV (human immunodeficiency virus). ?AIDS (acquired immunodeficiency syndrome). ?Cancer. ?Are taking medicines that weaken the immune system, such as organ transplant medicines. ?Are experiencing a lot of stress. ?What are the signs or symptoms? ?Early symptoms of this condition include itching, tingling, and pain in an area on your skin. Pain may be described as burning, stabbing, or throbbing. ?A few days or weeks after early symptoms start, a painful red rash appears. The rash is usually on one side of the body and has a band-like or belt-like pattern. The rash eventually turns into fluid-filled blisters that break open, change into scabs, and dry up in about 2-3 weeks. ?At any time during the infection, you may also develop: ?A fever. ?Chills. ?A headache. ?Nausea. ?How is this diagnosed? ?This condition is diagnosed with a skin exam. Skin or fluid samples (a culture) may be taken from the blisters before a diagnosis is made. ?How is this treated? ?The rash may last  for several weeks. There is not a specific cure for this condition. Your health care provider may prescribe medicines to help you manage pain, recover more quickly, and avoid long-term problems. Medicines may include: ?Antiviral medicines. ?Anti-inflammatory medicines. ?Pain medicines. ?Anti-itching medicines (antihistamines). ?If the area involved is on your face, you may be referred to a specialist, such as an eye doctor (ophthalmologist) or an ear, nose, and throat (ENT) doctor (otorhinolaryngologist) to help you avoid eye problems, chronic pain, or disability. ?Follow these instructions at home: ?Medicines ?Take over-the-counter and prescription medicines only as told by your health care provider. ?Apply an anti-itch cream or numbing cream to the affected area as told by your health care provider. ?Relieving itching and discomfort ? ?Apply cold, wet cloths (cold compresses) to the area of the rash or blisters as told by your health care provider. ?Cool baths can be soothing. Try adding baking soda or dry oatmeal to the water to reduce itching. Do not bathe in hot water. ?Use calamine lotion as recommended by your health care provider. This is an over-the-counter lotion that helps to relieve itchiness. ?Blister and rash care ?Keep your rash covered with a loose bandage (dressing). Wear loose-fitting clothing to help ease the pain of material rubbing against the rash. ?Wash your hands with soap and water for at least 20 seconds before and after you change your dressing. If soap and water are not available, use hand sanitizer. ?Change your dressing as told by your health care provider. ?Keep your rash and blisters clean by washing the area with mild soap and cool water as told by your health   care provider. ?Check your rash every day for signs of infection. Check for: ?More redness, swelling, or pain. ?Fluid or blood. ?Warmth. ?Pus or a bad smell. ?Do not scratch your rash or pick at your blisters. To help avoid  scratching: ?Keep your fingernails clean and cut short. ?Wear gloves or mittens while you sleep, if scratching is a problem. ?General instructions ?Rest as told by your health care provider. ?Wash your hands often with soap and water for at least 20 seconds. If soap and water are not available, use hand sanitizer. Doing this lowers your chance of getting a bacterial skin infection. ?Before your blisters change into scabs, your shingles infection can cause chickenpox in people who have never had it or have never been vaccinated against it. To prevent this from happening, avoid contact with other people, especially: ?Babies. ?Pregnant women. ?Children who have eczema. ?Older people who have transplants. ?People who have chronic illnesses, such as cancer or AIDS. ?Keep all follow-up visits. This is important. ?How is this prevented? ?Getting vaccinated is the best way to prevent shingles and protect against shingles complications. If you have not been vaccinated, talk with your health care provider about getting the vaccine. ?Where to find more information ?Centers for Disease Control and Prevention: www.cdc.gov ?Contact a health care provider if: ?Your pain is not relieved with prescribed medicines. ?Your pain does not get better after the rash heals. ?You have any of these signs of infection: ?More redness, swelling, or pain around the rash. ?Fluid or blood coming from the rash. ?Warmth coming from your rash. ?Pus or a bad smell coming from the rash. ?A fever. ?Get help right away if: ?The rash is on your face or nose. ?You have facial pain, pain around your eye area, or loss of feeling on one side of your face. ?You have difficulty seeing. ?You have ear pain or have ringing in your ear. ?You have a loss of taste. ?Your condition gets worse. ?Summary ?Shingles, also known as herpes zoster, is an infection that causes a painful skin rash and fluid-filled blisters. ?This condition is diagnosed with a skin exam. Skin or  fluid samples (a culture) may be taken from the blisters. ?Keep your rash covered with a loose bandage (dressing). Wear loose-fitting clothing to help ease the pain of material rubbing against the rash. ?Before your blisters change into scabs, your shingles infection can cause chickenpox in people who have never had it or have never been vaccinated against it. ?This information is not intended to replace advice given to you by your health care provider. Make sure you discuss any questions you have with your health care provider. ?Document Revised: 08/04/2020 Document Reviewed: 08/04/2020 ?Elsevier Patient Education ? 2022 Elsevier Inc. ? ?

## 2021-06-03 ENCOUNTER — Telehealth: Payer: Self-pay | Admitting: Pharmacist

## 2021-06-03 NOTE — Telephone Encounter (Signed)
Patient called. Was given a 7 day course of valacyclovir.  Concerned regarding warfarin interactions.  Advised interactions not likely and he should continue warfarin regimen as scheduled and keep INR appointment on 10/28.

## 2021-06-05 ENCOUNTER — Other Ambulatory Visit: Payer: Self-pay

## 2021-06-05 DIAGNOSIS — E785 Hyperlipidemia, unspecified: Secondary | ICD-10-CM

## 2021-06-05 MED ORDER — EZETIMIBE 10 MG PO TABS: 10.0000 mg | ORAL_TABLET | Freq: Every day | ORAL | 3 refills | Status: DC

## 2021-06-19 ENCOUNTER — Ambulatory Visit (INDEPENDENT_AMBULATORY_CARE_PROVIDER_SITE_OTHER): Payer: BC Managed Care – PPO

## 2021-06-19 ENCOUNTER — Other Ambulatory Visit: Payer: Self-pay

## 2021-06-19 DIAGNOSIS — Z952 Presence of prosthetic heart valve: Secondary | ICD-10-CM | POA: Diagnosis not present

## 2021-06-19 DIAGNOSIS — Z7901 Long term (current) use of anticoagulants: Secondary | ICD-10-CM

## 2021-06-19 DIAGNOSIS — Z5181 Encounter for therapeutic drug level monitoring: Secondary | ICD-10-CM | POA: Diagnosis not present

## 2021-06-19 LAB — POCT INR: INR: 1.5 — AB (ref 2.0–3.0)

## 2021-06-19 NOTE — Patient Instructions (Signed)
Continue taking 1 tablet daily except 1.5 tablets each Sunday and Saturday Next INR in 6 weeks. Call clinic at [phone] for changes in medication or procedures

## 2021-06-22 ENCOUNTER — Other Ambulatory Visit: Payer: Self-pay

## 2021-06-22 DIAGNOSIS — Z7901 Long term (current) use of anticoagulants: Secondary | ICD-10-CM

## 2021-06-22 DIAGNOSIS — Z952 Presence of prosthetic heart valve: Secondary | ICD-10-CM

## 2021-06-22 NOTE — Addendum Note (Signed)
Addended by: Truddie Hidden on: 06/22/2021 08:22 AM   Modules accepted: Orders

## 2021-06-23 LAB — PROTIME-INR
INR: 1.4 — ABNORMAL HIGH (ref 0.9–1.2)
Prothrombin Time: 15 s — ABNORMAL HIGH (ref 9.1–12.0)

## 2021-06-24 ENCOUNTER — Encounter: Payer: Self-pay | Admitting: Cardiology

## 2021-06-24 ENCOUNTER — Other Ambulatory Visit: Payer: Self-pay

## 2021-06-24 ENCOUNTER — Ambulatory Visit: Payer: BC Managed Care – PPO | Admitting: Cardiology

## 2021-06-24 VITALS — BP 124/68 | HR 64 | Ht 66.0 in | Wt 181.0 lb

## 2021-06-24 DIAGNOSIS — Z8679 Personal history of other diseases of the circulatory system: Secondary | ICD-10-CM | POA: Diagnosis not present

## 2021-06-24 DIAGNOSIS — Z952 Presence of prosthetic heart valve: Secondary | ICD-10-CM

## 2021-06-24 DIAGNOSIS — Z9889 Other specified postprocedural states: Secondary | ICD-10-CM

## 2021-06-24 DIAGNOSIS — I10 Essential (primary) hypertension: Secondary | ICD-10-CM

## 2021-06-24 NOTE — Patient Instructions (Addendum)
Medication Instructions:  Your physician has recommended you make the following change in your medication:   Take coumadin 7.5 on Wednesdays  *If you need a refill on your cardiac medications before your next appointment, please call your pharmacy*   Lab Work: Your physician recommends that you return for lab work today: CMP, CBC, LIPID   If you have labs (blood work) drawn today and your tests are completely normal, you will receive your results only by: [place] (if you have [place]) OR A paper copy in the mail If you have any lab test that is abnormal or we need to change your treatment, we will call you to review the results.   Testing/Procedures: Non-Cardiac CT scanning, (CAT scanning), is a noninvasive, special x-ray that produces cross-sectional images of the body using x-rays and a computer. CT scans help physicians diagnose and treat medical conditions. For some CT exams, a contrast material is used to enhance visibility in the area of the body being studied. CT scans provide greater clarity and reveal more details than regular x-ray exams.    Follow-Up: At [facility], you and your health needs are our priority.  As part of our continuing mission to provide you with exceptional heart care, we have created designated Provider Care Teams.  These Care Teams include your primary Cardiologist (physician) and Advanced Practice Providers (APPs -  Physician Assistants and Nurse Practitioners) who all work together to provide you with the care you need, when you need it.  We recommend signing up for the patient portal called "MyChart".  Sign up information is provided on this After Visit Summary.  MyChart is used to connect with patients for Virtual Visits (Telemedicine).  Patients are able to view lab/test results, encounter notes, upcoming appointments, etc.  Non-urgent messages can be sent to your provider as well.   To learn more about what you can do with MyChart, go to  [email or web address].    Your next appointment:   1 year(s)  The format for your next appointment:   In Person  Provider:   Jenne Campus, MD   Other Instructions

## 2021-06-24 NOTE — Progress Notes (Signed)
Cardiology Office Note:    Date:  06/24/2021   ID:  Larry Parks, DOB 03/08/1976, MRN [id number]  PCP:  Pcp, No  Cardiologist:  Jenne Campus, MD    Referring MD: No ref. provider found   Chief Complaint  Patient presents with   Follow-up  I am doing very well  History of Present Illness:    Larry Parks is a 45 y.o. male   status post aortic valve replacement with On-X 23 valve, aortic aneurysm repair, dyslipidemia, hypertriglyceridemia Comes today to my office for follow-up follow-up we will he is doing very well.  Denies have any chest pain tightness squeezing pressure burning chest.  He exercised on the regular basis with no difficulties he has not been seen in my office for more than a year.  Past Medical History:  Diagnosis Date   Allergy    Angioedema of lips 10/22/2019   Anxiety    Depression    Dissecting aortic aneurysm (any part), thoracic    2019   Dyslipidemia 03/12/2019   Hepatic steatosis 11/03/2017   Formatting of this note might be different from the original. Noted on CT chest obtained 11/03/2017   Hyperlipidemia    Hypertension    Hypertriglyceridemia 04/23/2020   Other allergic rhinitis 11/14/2019   Pruritic rash 11/14/2019   Sleep apnea    uses a CPAP   Status post aortic valve replacement 03/12/2019   Status post ascending aortic aneurysm repair 12/03/2017   Formatting of this note might be different from the original. 11/29/2017 Aortic root replacement with #23 OnX mechanical composite prosthesis with coronary reconstruction and graft replacement of ascending aorta with #26 Hemashield (Dr.Rowe).   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/2019    Past Surgical History:  Procedure Laterality Date   aortic valve replaced  11/2017   aortic aneurism    Current Medications: Current Meds  Medication Sig   Cholecalciferol (VITAMIN D3) 125 MCG (5000 UT) CAPS Take 1 capsule by mouth daily.   co-enzyme Q-10 30 MG capsule Take 30 mg by mouth 3  (three) times daily.   ezetimibe (ZETIA) 10 MG tablet Take 1 tablet (10 mg total) by mouth daily.   Omega-3 Fatty Acids (FISH OIL PO) Take 1 tablet by mouth daily. 2 capsules twice a day   rosuvastatin (CRESTOR) 10 MG tablet Take 1 tablet (10 mg total) by mouth daily.   warfarin (COUMADIN) 5 MG tablet TAKE 7.5 MG BY MOUTH ON TUEDAY, THURSDAY, SATURDAY AND SUNDAY 10 MG ON MONDAY, WEDNESDAY, FRIDAY (Patient taking differently: Take 5 mg by mouth See admin instructions. TAKE 7.5 MG BY MOUTH ON TUEDAY, THURSDAY, SATURDAY AND SUNDAY 10 MG ON MONDAY, WEDNESDAY, FRIDAY)     Allergies:   Pollen extract   Social History   Socioeconomic History   Marital status: Married    Spouse name: Not on file   Number of children: 2   Years of education: Not on file   Highest education level: Not on file  Occupational History   Occupation: Assoc. prof. @ [employer]  Tobacco Use   Smoking status: Former    Types: Cigarettes    Quit date: 2004    Years since quitting: 18.8   Smokeless tobacco: Never  Vaping Use   Vaping Use: Never used  Substance and Sexual Activity   Alcohol use: Not Currently    Comment: socially - last 2008   Drug use: Never   Sexual activity: Not on file  Other Topics  Concern   Not on file  Social History Narrative   Caffeine: 2 cups/day   Social Determinants of Health   Financial Resource Strain: Not on file  Food Insecurity: Not on file  Transportation Needs: Not on file  Physical Activity: Not on file  Stress: Not on file  Social Connections: Not on file     Family History: The patient's family history includes Diabetes in his father; Heart attack in his father. There is no history of Colon cancer, Esophageal cancer, Rectal cancer, Stomach cancer, or Prostate cancer. ROS:   Please see the history of present illness.    All 14 point review of systems negative except as described per history of present illness  EKGs/Labs/Other Studies Reviewed:      Recent  Labs: 02/24/2021: ALT 22; BUN 14; Creatinine, Ser 0.96; Hemoglobin 14.0; Platelets 182.0; Potassium 3.7; Sodium 139  Recent Lipid Panel    Component Value Date/Time   CHOL 105 02/24/2021 1039   CHOL 99 (L) 11/24/2020 1125   TRIG 96.0 02/24/2021 1039   HDL 44.30 02/24/2021 1039   HDL 44 11/24/2020 1125   CHOLHDL 2 02/24/2021 1039   VLDL 19.2 02/24/2021 1039   LDLCALC 42 02/24/2021 1039   LDLCALC 39 11/24/2020 1125    Physical Exam:    VS:  BP 124/68 (BP Location: Right Arm, Patient Position: Sitting)   Pulse 64   Ht 5\' 6"  (1.676 m)   Wt 181 lb (82.1 kg)   SpO2 98%   BMI 29.21 kg/m     Wt Readings from Last 3 Encounters:  06/24/21 181 lb (82.1 kg)  06/02/21 180 lb 9.6 oz (81.9 kg)  05/28/21 178 lb (80.7 kg)     GEN:  Well nourished, well developed in no acute distress HEENT: Normal NECK: No JVD; No carotid bruits LYMPHATICS: No lymphadenopathy CARDIAC: RRR, crisp aortic valve sounds.  No murmurs, no rubs, no gallops RESPIRATORY:  Clear to auscultation without rales, wheezing or rhonchi  ABDOMEN: Soft, non-tender, non-distended MUSCULOSKELETAL:  No edema; No deformity  SKIN: Warm and dry LOWER EXTREMITIES: no swelling NEUROLOGIC:  Alert and oriented x 3 PSYCHIATRIC:  Normal affect   ASSESSMENT:    1. Primary hypertension   2. Status post aortic valve replacement   3. Status post thoracic aortic aneurysm repair    PLAN:    In order of problems listed above:  Essential hypertension blood pressure well controlled continue present management. Status post aortic valve replacement valve functioning properly echocardiogram from last year reviewed normal function.  Continue monitoring continue anticoagulation Status post thoracic aortic aneurysm repair: We will schedule him to have another CT to look at the repair.  Recommendation by radiology was to do it every single year.  Luckily clinically is doing very well. INR subtherapeutic goal but he is above target INR is 2.   He is a 1.4.  He takes 7.5 mg Saturday Sunday and 5 mg all other days.  I asked him to increase dose of minimally by extra 2.5 mg on Wednesday which means he will take 7.5 mg on Wednesday everything else will be the same.  I will ask him to have INR done in 2 weeks   Medication Adjustments/Labs and Tests Ordered: Current medicines are reviewed at length with the patient today.  Concerns regarding medicines are outlined above.  No orders of the defined types were placed in this encounter.  Medication changes: No orders of the defined types were placed in this encounter.  Signed, Park Liter, MD, [facility] 06/24/2021 10:16 AM    [place]

## 2021-06-25 ENCOUNTER — Encounter: Payer: BC Managed Care – PPO | Admitting: Family Medicine

## 2021-06-25 LAB — CBC
Hematocrit: 42.2 % (ref 37.5–51.0)
Hemoglobin: 14.2 g/dL (ref 13.0–17.7)
MCH: 28.6 pg (ref 26.6–33.0)
MCHC: 33.6 g/dL (ref 31.5–35.7)
MCV: 85 fL (ref 79–97)
Platelets: 203 10*3/uL (ref 150–450)
RBC: 4.97 x10E6/uL (ref 4.14–5.80)
RDW: 13.1 % (ref 11.6–15.4)
WBC: 5.9 10*3/uL (ref 3.4–10.8)

## 2021-06-25 LAB — COMPREHENSIVE METABOLIC PANEL
ALT: 29 IU/L (ref 0–44)
AST: 26 IU/L (ref 0–40)
Albumin/Globulin Ratio: 2.4 — ABNORMAL HIGH (ref 1.2–2.2)
Albumin: 4.7 g/dL (ref 4.0–5.0)
Alkaline Phosphatase: 68 IU/L (ref 44–121)
BUN/Creatinine Ratio: 16 (ref 9–20)
BUN: 15 mg/dL (ref 6–24)
Bilirubin Total: 0.3 mg/dL (ref 0.0–1.2)
CO2: 25 mmol/L (ref 20–29)
Calcium: 9.4 mg/dL (ref 8.7–10.2)
Chloride: 100 mmol/L (ref 96–106)
Creatinine, Ser: 0.96 mg/dL (ref 0.76–1.27)
Globulin, Total: 2 g/dL (ref 1.5–4.5)
Glucose: 87 mg/dL (ref 70–99)
Potassium: 3.9 mmol/L (ref 3.5–5.2)
Sodium: 137 mmol/L (ref 134–144)
Total Protein: 6.7 g/dL (ref 6.0–8.5)
eGFR: 98 mL/min/{1.73_m2} (ref >59)

## 2021-06-25 LAB — LIPID PANEL
Chol/HDL Ratio: 2.7 ratio (ref 0.0–5.0)
Cholesterol, Total: 105 mg/dL (ref 100–199)
HDL: 39 mg/dL — ABNORMAL LOW (ref >39)
LDL Chol Calc (NIH): 47 mg/dL (ref 0–99)
Triglycerides: 101 mg/dL (ref 0–149)
VLDL Cholesterol Cal: 19 mg/dL (ref 5–40)

## 2021-07-02 ENCOUNTER — Other Ambulatory Visit: Payer: Self-pay

## 2021-07-02 ENCOUNTER — Ambulatory Visit: Payer: BC Managed Care – PPO | Admitting: Family Medicine

## 2021-07-02 ENCOUNTER — Encounter: Payer: Self-pay | Admitting: Family Medicine

## 2021-07-02 ENCOUNTER — Ambulatory Visit (INDEPENDENT_AMBULATORY_CARE_PROVIDER_SITE_OTHER): Payer: BC Managed Care – PPO

## 2021-07-02 VITALS — BP 116/74 | HR 62 | Temp 97.6°F | Ht 66.0 in | Wt 179.8 lb

## 2021-07-02 DIAGNOSIS — L84 Corns and callosities: Secondary | ICD-10-CM | POA: Diagnosis not present

## 2021-07-02 DIAGNOSIS — M79672 Pain in left foot: Secondary | ICD-10-CM

## 2021-07-02 MED ORDER — DICLOFENAC SODIUM 1 % EX GEL: CUTANEOUS | 2 refills | Status: AC

## 2021-07-02 NOTE — Progress Notes (Signed)
Established Patient Office Visit  Subjective:  Patient ID: Larry Parks, male    DOB: March 31, 1976  Age: 45 y.o. MRN: [id number]  CC:  Chief Complaint  Patient presents with   Pain    Pain in feet x 2-3 weeks also feel some tingling in left foot.     HPI Larry Parks presents for evaluation of a 3-week history of discomfort of the sole of his left foot.  There was no injury.  Denies prolonged standing.  Discomfort seems to run from the balls of his feet and heels.  Denies specific pain after rest getting out of the bed in the morning.  Tells of a firm spot in the arch of his foot.  Past Medical History:  Diagnosis Date   Allergy    Angioedema of lips 10/22/2019   Anxiety    Depression    Dissecting aortic aneurysm (any part), thoracic    2019   Dyslipidemia 03/12/2019   Hepatic steatosis 11/03/2017   Formatting of this note might be different from the original. Noted on CT chest obtained 11/03/2017   Hyperlipidemia    Hypertension    Hypertriglyceridemia 04/23/2020   Other allergic rhinitis 11/14/2019   Pruritic rash 11/14/2019   Sleep apnea    uses a CPAP   Status post aortic valve replacement 03/12/2019   Status post ascending aortic aneurysm repair 12/03/2017   Formatting of this note might be different from the original. 11/29/2017 Aortic root replacement with #23 OnX mechanical composite prosthesis with coronary reconstruction and graft replacement of ascending aorta with #26 Hemashield (Dr.Rowe).   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/2019    Past Surgical History:  Procedure Laterality Date   aortic valve replaced  11/2017   aortic aneurism    Family History  Problem Relation Age of Onset   Diabetes Father    Heart attack Father    Colon cancer Neg Hx    Esophageal cancer Neg Hx    Rectal cancer Neg Hx    Stomach cancer Neg Hx    Prostate cancer Neg Hx     Social History   Socioeconomic History   Marital status: Married    Spouse name: Not on  file   Number of children: 2   Years of education: Not on file   Highest education level: Not on file  Occupational History   Occupation: Assoc. prof. @ [employer]  Tobacco Use   Smoking status: Former    Types: Cigarettes    Quit date: 2004    Years since quitting: 18.8   Smokeless tobacco: Never  Vaping Use   Vaping Use: Never used  Substance and Sexual Activity   Alcohol use: Not Currently    Comment: socially - last 2008   Drug use: Never   Sexual activity: Not on file  Other Topics Concern   Not on file  Social History Narrative   Caffeine: 2 cups/day   Social Determinants of Health   Financial Resource Strain: Not on file  Food Insecurity: Not on file  Transportation Needs: Not on file  Physical Activity: Not on file  Stress: Not on file  Social Connections: Not on file  Intimate Partner Violence: Not on file    Outpatient Medications Prior to Visit  Medication Sig Dispense Refill   Cholecalciferol (VITAMIN D3) 125 MCG (5000 UT) CAPS Take 1 capsule by mouth daily.     co-enzyme Q-10 30 MG capsule Take 30 mg by mouth 3 (  three) times daily.     ezetimibe (ZETIA) 10 MG tablet Take 1 tablet (10 mg total) by mouth daily. 90 tablet 3   Omega-3 Fatty Acids (FISH OIL PO) Take 1 tablet by mouth daily. 2 capsules twice a day     rosuvastatin (CRESTOR) 10 MG tablet Take 1 tablet (10 mg total) by mouth daily. 90 tablet 3   warfarin (COUMADIN) 5 MG tablet TAKE 7.5 MG BY MOUTH ON TUEDAY, THURSDAY, SATURDAY AND SUNDAY 10 MG ON MONDAY, WEDNESDAY, FRIDAY (Patient taking differently: Take 5 mg by mouth See admin instructions. TAKE 7.5 MG BY MOUTH ON TUEDAY, THURSDAY, SATURDAY AND SUNDAY 10 MG ON MONDAY, WEDNESDAY, FRIDAY) 192 tablet 3   No facility-administered medications prior to visit.    Allergies  Allergen Reactions   Pollen Extract Itching    ROS Review of Systems  Constitutional: Negative.   HENT: Negative.    Eyes:  Negative for photophobia and visual disturbance.   Respiratory: Negative.    Cardiovascular: Negative.   Gastrointestinal: Negative.   Endocrine: Negative for polyphagia and polyuria.  Musculoskeletal:  Positive for arthralgias. Negative for gait problem.  Neurological:  Negative for speech difficulty and weakness.     Objective:    Physical Exam Vitals and nursing note reviewed.  Constitutional:      General: He is not in acute distress.    Appearance: Normal appearance. He is not ill-appearing or toxic-appearing.  HENT:     Head: Normocephalic and atraumatic.     Right Ear: External ear normal.     Left Ear: External ear normal.  Eyes:     General: No scleral icterus.       Right eye: No discharge.        Left eye: No discharge.     Extraocular Movements: Extraocular movements intact.     Conjunctiva/sclera: Conjunctivae normal.  Cardiovascular:     Pulses:          Dorsalis pedis pulses are 2+ on the right side and 2+ on the left side.       Posterior tibial pulses are 2+ on the right side and 2+ on the left side.  Pulmonary:     Effort: Pulmonary effort is normal.  Musculoskeletal:       Legs:  Skin:    General: Skin is warm and dry.  Neurological:     Mental Status: He is alert and oriented to person, place, and time.    BP 116/74 (BP Location: Right Arm, Patient Position: Sitting, Cuff Size: Normal)   Pulse 62   Temp 97.6 F (36.4 C) (Temporal)   Ht 5\' 6"  (1.676 m)   Wt 179 lb 12.8 oz (81.6 kg)   SpO2 97%   BMI 29.02 kg/m  Wt Readings from Last 3 Encounters:  07/02/21 179 lb 12.8 oz (81.6 kg)  06/24/21 181 lb (82.1 kg)  06/02/21 180 lb 9.6 oz (81.9 kg)     Health Maintenance Due  Topic Date Due   HIV Screening  Never done   Hepatitis C Screening  Never done   INFLUENZA VACCINE  03/23/2021    There are no preventive care reminders to display for this patient.  No results found for: TSH Lab Results  Component Value Date   WBC 5.9 06/24/2021   HGB 14.2 06/24/2021   HCT 42.2 06/24/2021   MCV  85 06/24/2021   PLT 203 06/24/2021   Lab Results  Component Value Date   NA 137 06/24/2021  K 3.9 06/24/2021   CO2 25 06/24/2021   GLUCOSE 87 06/24/2021   BUN 15 06/24/2021   CREATININE 0.96 06/24/2021   BILITOT 0.3 06/24/2021   ALKPHOS 68 06/24/2021   AST 26 06/24/2021   ALT 29 06/24/2021   PROT 6.7 06/24/2021   ALBUMIN 4.7 06/24/2021   CALCIUM 9.4 06/24/2021   EGFR 98 06/24/2021   GFR 94.14 02/24/2021   Lab Results  Component Value Date   CHOL 105 06/24/2021   Lab Results  Component Value Date   HDL 39 (L) 06/24/2021   Lab Results  Component Value Date   LDLCALC 47 06/24/2021   Lab Results  Component Value Date   TRIG 101 06/24/2021   Lab Results  Component Value Date   CHOLHDL 2.7 06/24/2021   Lab Results  Component Value Date   HGBA1C 6.1 02/24/2021      Assessment & Plan:   Problem List Items Addressed This Visit       Other   Left foot pain - Primary   Relevant Medications   diclofenac Sodium (VOLTAREN) 1 % GEL   Other Relevant Orders   DG Foot Complete Left   Ambulatory referral to Sports Medicine    Meds ordered this encounter  Medications   diclofenac Sodium (VOLTAREN) 1 % GEL    Sig: Apply a small grape sized dollop to sore place on foot 3 times daily as needed.    Dispense:  150 g    Refill:  2     Follow-up: Return Has scheduled appointment in a few weeks for physical..   Information was given about foot pain and Planter fasciitis.  Sports medicine referral for second opinion.  May need inserts?  Mass of left foot? Libby Maw, MD

## 2021-07-03 ENCOUNTER — Encounter (HOSPITAL_BASED_OUTPATIENT_CLINIC_OR_DEPARTMENT_OTHER): Payer: Self-pay

## 2021-07-03 ENCOUNTER — Ambulatory Visit (HOSPITAL_BASED_OUTPATIENT_CLINIC_OR_DEPARTMENT_OTHER)
Admission: RE | Admit: 2021-07-03 | Discharge: 2021-07-03 | Disposition: A | Discharge status: Home / Self Care | Payer: BC Managed Care – PPO | Source: Ambulatory Visit | Attending: Cardiology | Admitting: Cardiology

## 2021-07-03 DIAGNOSIS — Z8679 Personal history of other diseases of the circulatory system: Secondary | ICD-10-CM | POA: Diagnosis present

## 2021-07-03 DIAGNOSIS — I10 Essential (primary) hypertension: Secondary | ICD-10-CM | POA: Insufficient documentation

## 2021-07-03 DIAGNOSIS — Z9889 Other specified postprocedural states: Secondary | ICD-10-CM | POA: Diagnosis present

## 2021-07-03 DIAGNOSIS — Z952 Presence of prosthetic heart valve: Secondary | ICD-10-CM | POA: Insufficient documentation

## 2021-07-03 MED ORDER — IOHEXOL 350 MG/ML SOLN
100.0000 mL | Freq: Once | INTRAVENOUS | Status: AC | PRN
  Administered 2021-07-03: 100 mL via INTRAVENOUS

## 2021-07-07 ENCOUNTER — Ambulatory Visit: Payer: BC Managed Care – PPO | Admitting: Sports Medicine

## 2021-07-07 VITALS — BP 120/82 | Ht 66.0 in | Wt 180.0 lb

## 2021-07-07 DIAGNOSIS — M722 Plantar fascial fibromatosis: Secondary | ICD-10-CM | POA: Diagnosis not present

## 2021-07-07 DIAGNOSIS — M79672 Pain in left foot: Secondary | ICD-10-CM

## 2021-07-07 NOTE — Patient Instructions (Signed)
It was very nice meeting you today!  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  Continue using the Voltaren gel twice a day for 2 weeks and use the arch strap during the day.  Do not sleep in it however.  I will also give you some plantar fascial stretches.  Follow-up with me again in 3 to 4 weeks.  Once your pain has improved we will make you some orthotics.  If the fibroma continues to cause you pain however, I will refer you to Dr. Amalia Hailey (podiatry) for further treatment of that.

## 2021-07-07 NOTE — Progress Notes (Signed)
   Subjective:    Patient ID: Larry Parks, male    DOB: 1976-08-04, 45 y.o.   MRN: [id number]  HPI chief complaint: Left foot pain  Very pleasant 45 year old male comes in today complaining of left foot pain has been present for about 3 weeks.  He does not recall any trauma.  He has noticed a painful bump along the plantar aspect of his foot which she localizes to the mid substance of the plantar fascia.  He does have some mild pain at the calcaneus but not severe.  He saw his PCP who suggested that he try some Voltaren gel.  This has been helpful.  His PCP also thought he might benefit from orthotics.  Patient does also complain of some mild foot pain on the right but has not noticed any painful bumps like what he is experiencing on the left.  Past medical history reviewed Medications reviewed Allergies reviewed    Review of Systems As above    Objective:   Physical Exam  Well-developed, well-nourished.  No acute distress.  Awake alert and oriented x3.  Vital signs reviewed  Examination of both feet show a cavus foot bilaterally.  There is a palpable plantar fibroma in the midportion of the plantar fascia on the left.  No fibroma on the right.  Minimal tenderness to palpation at the calcaneal origin of the plantar fascia.  Negative calcaneal squeeze.  No soft tissue swelling.  Good pulses.  Neurovascularly intact distally.      Assessment & Plan:   Left foot plantar fibroma Cavus foot  I recommend that the patient continue with topical Voltaren for his plantar fibroma.  I recommend twice daily for 2 weeks.  We will also fit him with an arch strap as I think the compression may be helpful.  We will give him some simple plantar fascial and calf stretches and I will see him back in the office in 3 weeks.  Once his pain from his plantar fibroma resolves, we will consider custom orthotics.  If he continues to have pain from his plantar fibroma despite today's treatment, then consider  referral to podiatry (Dr. Amalia Hailey) for further treatment.  This note was dictated using Dragon naturally speaking software and may contain errors in syntax, spelling, or content which have not been identified prior to signing this note.

## 2021-07-08 ENCOUNTER — Ambulatory Visit (INDEPENDENT_AMBULATORY_CARE_PROVIDER_SITE_OTHER): Payer: BC Managed Care – PPO

## 2021-07-08 ENCOUNTER — Other Ambulatory Visit: Payer: Self-pay

## 2021-07-08 DIAGNOSIS — Z5181 Encounter for therapeutic drug level monitoring: Secondary | ICD-10-CM

## 2021-07-08 DIAGNOSIS — Z952 Presence of prosthetic heart valve: Secondary | ICD-10-CM

## 2021-07-08 LAB — POCT INR: INR: 1.6 — AB (ref 2.0–3.0)

## 2021-07-08 NOTE — Patient Instructions (Signed)
Take 2 tablets tonight and Thursday night and then Continue taking 1 tablet daily except 1.5 tablets each Sunday, Wednesday and Saturday Next INR in 3 weeks. Call clinic at [phone] for changes in medication or procedures

## 2021-07-31 ENCOUNTER — Ambulatory Visit (INDEPENDENT_AMBULATORY_CARE_PROVIDER_SITE_OTHER): Payer: BC Managed Care – PPO

## 2021-07-31 ENCOUNTER — Other Ambulatory Visit: Payer: Self-pay

## 2021-07-31 DIAGNOSIS — Z5181 Encounter for therapeutic drug level monitoring: Secondary | ICD-10-CM | POA: Diagnosis not present

## 2021-07-31 DIAGNOSIS — Z952 Presence of prosthetic heart valve: Secondary | ICD-10-CM | POA: Diagnosis not present

## 2021-07-31 DIAGNOSIS — Z7901 Long term (current) use of anticoagulants: Secondary | ICD-10-CM | POA: Diagnosis not present

## 2021-07-31 LAB — POCT INR: INR: 2.1 (ref 2.0–3.0)

## 2021-07-31 NOTE — Patient Instructions (Signed)
Continue taking 1 tablet daily except 1.5 tablets each Sunday, Wednesday and Saturday Next INR in 6 weeks. Call clinic at [phone] for changes in medication or procedures

## 2021-08-18 ENCOUNTER — Ambulatory Visit: Payer: BC Managed Care – PPO | Admitting: Family Medicine

## 2021-08-18 ENCOUNTER — Encounter: Payer: Self-pay | Admitting: Family Medicine

## 2021-08-18 ENCOUNTER — Other Ambulatory Visit: Payer: Self-pay

## 2021-08-18 VITALS — BP 122/68 | HR 83 | Temp 97.5°F | Ht 66.0 in | Wt 185.4 lb

## 2021-08-18 DIAGNOSIS — Z23 Encounter for immunization: Secondary | ICD-10-CM | POA: Diagnosis not present

## 2021-08-18 DIAGNOSIS — M79672 Pain in left foot: Secondary | ICD-10-CM

## 2021-08-18 DIAGNOSIS — E785 Hyperlipidemia, unspecified: Secondary | ICD-10-CM

## 2021-08-18 NOTE — Progress Notes (Addendum)
Established Patient Office Visit  Subjective:  Patient ID: Larry Parks, male    DOB: March 11, 1976  Age: 45 y.o. MRN: [id number]  CC:  Chief Complaint  Patient presents with   Transitions Of Care    TOC from Dr. Bryan Lemma, no concerns. Patient not fasting.     HPI Larry Parks presents for transfer and establishment of care.  Left foot pain has improved.  Continues using Voltaren gel as needed.  He is not fasting today.  Family history of MI and diabetes..  Status post ascending aortic aneurysm repair with aortic valve replacement in 2019.  Continues warfarin anticoagulation.  Continues rosuvastatin and Zetia and omega-3 fatty acids mixed hyper lipidemia.  Health maintenance is up-to-date.  Past Medical History:  Diagnosis Date   Allergy    Angioedema of lips 10/22/2019   Anxiety    Depression    Dissecting aortic aneurysm (any part), thoracic    2019   Dyslipidemia 03/12/2019   Hepatic steatosis 11/03/2017   Formatting of this note might be different from the original. Noted on CT chest obtained 11/03/2017   Hyperlipidemia    Hypertension    Hypertriglyceridemia 04/23/2020   Other allergic rhinitis 11/14/2019   Pruritic rash 11/14/2019   Sleep apnea    uses a CPAP   Status post aortic valve replacement 03/12/2019   Status post ascending aortic aneurysm repair 12/03/2017   Formatting of this note might be different from the original. 11/29/2017 Aortic root replacement with #23 OnX mechanical composite prosthesis with coronary reconstruction and graft replacement of ascending aorta with #26 Hemashield (Dr.Rowe).   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/2019    Past Surgical History:  Procedure Laterality Date   aortic valve replaced  11/2017   aortic aneurism    Family History  Problem Relation Age of Onset   Diabetes Father    Heart attack Father    Colon cancer Neg Hx    Esophageal cancer Neg Hx    Rectal cancer Neg Hx    Stomach cancer Neg Hx    Prostate  cancer Neg Hx     Social History   Socioeconomic History   Marital status: Married    Spouse name: Not on file   Number of children: 2   Years of education: Not on file   Highest education level: Not on file  Occupational History   Occupation: Assoc. prof. @ [employer]  Tobacco Use   Smoking status: Former    Types: Cigarettes    Quit date: 2004    Years since quitting: 19.0   Smokeless tobacco: Never  Vaping Use   Vaping Use: Never used  Substance and Sexual Activity   Alcohol use: Not Currently    Comment: socially - last 2008   Drug use: Never   Sexual activity: Not on file  Other Topics Concern   Not on file  Social History Narrative   Caffeine: 2 cups/day   Social Determinants of Health   Financial Resource Strain: Not on file  Food Insecurity: Not on file  Transportation Needs: Not on file  Physical Activity: Not on file  Stress: Not on file  Social Connections: Not on file  Intimate Partner Violence: Not on file    Outpatient Medications Prior to Visit  Medication Sig Dispense Refill   Cholecalciferol (VITAMIN D3) 125 MCG (5000 UT) CAPS Take 1 capsule by mouth daily.     co-enzyme Q-10 30 MG capsule Take 30 mg by mouth  3 (three) times daily.     diclofenac Sodium (VOLTAREN) 1 % GEL Apply a small grape sized dollop to sore place on foot 3 times daily as needed. 150 g 2   ezetimibe (ZETIA) 10 MG tablet Take 1 tablet (10 mg total) by mouth daily. 90 tablet 3   Omega-3 Fatty Acids (FISH OIL PO) Take 1 tablet by mouth daily. 2 capsules twice a day     rosuvastatin (CRESTOR) 10 MG tablet Take 1 tablet (10 mg total) by mouth daily. 90 tablet 3   warfarin (COUMADIN) 5 MG tablet TAKE 7.5 MG BY MOUTH ON TUEDAY, THURSDAY, SATURDAY AND SUNDAY 10 MG ON MONDAY, WEDNESDAY, FRIDAY (Patient taking differently: Take 5 mg by mouth See admin instructions. TAKE 7.5 MG BY MOUTH ON TUEDAY, THURSDAY, SATURDAY AND SUNDAY 10 MG ON MONDAY, WEDNESDAY, FRIDAY) 192 tablet 3   No  facility-administered medications prior to visit.    Allergies  Allergen Reactions   Pollen Extract Itching    ROS Review of Systems    Objective:    Physical Exam  BP 122/68 (BP Location: Left Arm, Patient Position: Sitting, Cuff Size: Normal)    Pulse 83    Temp (!) 97.5 F (36.4 C) (Temporal)    Ht '5\' 6"'  (1.676 m)    Wt 185 lb 6.4 oz (84.1 kg)    SpO2 97%    BMI 29.92 kg/m  Wt Readings from Last 3 Encounters:  08/18/21 185 lb 6.4 oz (84.1 kg)  07/07/21 180 lb (81.6 kg)  07/02/21 179 lb 12.8 oz (81.6 kg)     Health Maintenance Due  Topic Date Due   HIV Screening  Never done   Hepatitis C Screening  Never done   INFLUENZA VACCINE  03/23/2021    There are no preventive care reminders to display for this patient.  No results found for: TSH Lab Results  Component Value Date   WBC 5.9 06/24/2021   HGB 14.2 06/24/2021   HCT 42.2 06/24/2021   MCV 85 06/24/2021   PLT 203 06/24/2021   Lab Results  Component Value Date   NA 137 06/24/2021   K 3.9 06/24/2021   CO2 25 06/24/2021   GLUCOSE 87 06/24/2021   BUN 15 06/24/2021   CREATININE 0.96 06/24/2021   BILITOT 0.3 06/24/2021   ALKPHOS 68 06/24/2021   AST 26 06/24/2021   ALT 29 06/24/2021   PROT 6.7 06/24/2021   ALBUMIN 4.7 06/24/2021   CALCIUM 9.4 06/24/2021   EGFR 98 06/24/2021   GFR 94.14 02/24/2021   Lab Results  Component Value Date   CHOL 105 06/24/2021   Lab Results  Component Value Date   HDL 39 (L) 06/24/2021   Lab Results  Component Value Date   LDLCALC 47 06/24/2021   Lab Results  Component Value Date   TRIG 101 06/24/2021   Lab Results  Component Value Date   CHOLHDL 2.7 06/24/2021   Lab Results  Component Value Date   HGBA1C 6.1 02/24/2021      Assessment & Plan:   Problem List Items Addressed This Visit       Other   Dyslipidemia   Left foot pain - Primary   Flu vaccine need   Relevant Orders   Flu Vaccine QUAD 6+ mos PF IM (Fluarix Quad PF)    No orders of the  defined types were placed in this encounter.   Follow-up: Return in about 6 months (around 02/16/2022), or Return fasting for physical exam  in June..  Will continue rosuvastatin, Zetia and omega-3 fatty acids for dyslipidemia.  Left foot has improved.  Will receive a flu vaccine today.  Libby Maw, MD

## 2021-08-31 ENCOUNTER — Encounter: Payer: BC Managed Care – PPO | Admitting: Family Medicine

## 2021-09-09 ENCOUNTER — Telehealth: Payer: Self-pay | Admitting: Family Medicine

## 2021-09-09 ENCOUNTER — Other Ambulatory Visit: Payer: Self-pay

## 2021-09-09 NOTE — Telephone Encounter (Signed)
Received call from [place] at pts dental office. Pt walked into their office stating he is having bleeding since dental procedure yesterday (extractions and laser procedure). Pt noted he did take warfarin yesterday but has not taken today. Pt is scheduled for OV 1/19 with Dr. Ethelene Hal. Dental office states pt needs call today to tell him if he needs to take warfarin today and tomorrow prior to visit & if he needs to keep appt.   Call back [phone]

## 2021-09-10 ENCOUNTER — Ambulatory Visit: Payer: BC Managed Care – PPO | Admitting: Family Medicine

## 2021-09-10 ENCOUNTER — Encounter: Payer: Self-pay | Admitting: Family Medicine

## 2021-09-10 VITALS — BP 122/66 | HR 72 | Temp 97.7°F | Ht 66.0 in | Wt 182.2 lb

## 2021-09-10 DIAGNOSIS — Z789 Other specified health status: Secondary | ICD-10-CM

## 2021-09-10 DIAGNOSIS — R351 Nocturia: Secondary | ICD-10-CM

## 2021-09-10 DIAGNOSIS — N401 Enlarged prostate with lower urinary tract symptoms: Secondary | ICD-10-CM

## 2021-09-10 MED ORDER — TAMSULOSIN HCL 0.4 MG PO CAPS: 0.4000 mg | ORAL_CAPSULE | Freq: Every day | ORAL | 1 refills | Status: DC

## 2021-09-10 NOTE — Telephone Encounter (Signed)
Dr. Ethelene Hal and CMA out of office during time of call. Appointment scheduled 09/10/20 for OV to follow up on message below with Dr. Ethelene Hal

## 2021-09-10 NOTE — Progress Notes (Signed)
Established Patient Office Visit  Subjective:  Patient ID: Larry Parks, male    DOB: [birth year]  Age: 46 y.o. MRN: [id number]  CC:  Chief Complaint  Patient presents with   Advice Only    Discuss Warfarin how patient should take after recent dental surgery.     HPI Larry Parks presents for for follow-up of bleeding he experienced after a dental procedure 2 days ago.  He is on chronic warfarin therapy for aortic valve replacement.  He stopped his warfarin yesterday after the bleeding started.  He had been given ibuprofen to take for pain.  It sounds as though there was a deep cleaning of his teeth.  He is no longer bleeding.  He has held his warfarin.  He is no longer taking ibuprofen.  Denies any blood in his stool or urine.  He has been some issues with urine flow.  Decrease in force of the stream and nocturia x2-3.  He has decreased his p.m. fluid intake and this is helped the nocturia.  He still is interested in therapy.  Family history is unknown.  He has had some recent issue with ED.  Past Medical History:  Diagnosis Date   Allergy    Angioedema of lips 10/22/2019   Anxiety    Depression    Dissecting aortic aneurysm (any part), thoracic    2019   Dyslipidemia 03/12/2019   Hepatic steatosis 11/03/2017   Formatting of this note might be different from the original. Noted on CT chest obtained 11/03/2017   Hyperlipidemia    Hypertension    Hypertriglyceridemia 04/23/2020   Other allergic rhinitis 11/14/2019   Pruritic rash 11/14/2019   Sleep apnea    uses a CPAP   Status post aortic valve replacement 03/12/2019   Status post ascending aortic aneurysm repair 12/03/2017   Formatting of this note might be different from the original. 11/29/2017 Aortic root replacement with #23 OnX mechanical composite prosthesis with coronary reconstruction and graft replacement of ascending aorta with #26 Hemashield (Dr.Rowe).   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/2019    Past  Surgical History:  Procedure Laterality Date   aortic valve replaced  11/2017   aortic aneurism    Family History  Problem Relation Age of Onset   Diabetes Father    Heart attack Father    Colon cancer Neg Hx    Esophageal cancer Neg Hx    Rectal cancer Neg Hx    Stomach cancer Neg Hx    Prostate cancer Neg Hx     Social History   Socioeconomic History   Marital status: Married    Spouse name: Not on file   Number of children: 2   Years of education: Not on file   Highest education level: Not on file  Occupational History   Occupation: Assoc. prof. @ [employer]  Tobacco Use   Smoking status: Former    Types: Cigarettes    Quit date: 2004    Years since quitting: 19.0   Smokeless tobacco: Never  Vaping Use   Vaping Use: Never used  Substance and Sexual Activity   Alcohol use: Not Currently    Comment: socially - last 2008   Drug use: Never   Sexual activity: Not on file  Other Topics Concern   Not on file  Social History Narrative   Caffeine: 2 cups/day   Social Determinants of Health   Financial Resource Strain: Not on file  Food Insecurity: Not on  file  Transportation Needs: Not on file  Physical Activity: Not on file  Stress: Not on file  Social Connections: Not on file  Intimate Partner Violence: Not on file    Outpatient Medications Prior to Visit  Medication Sig Dispense Refill   amoxicillin (AMOXIL) 500 MG capsule Take by mouth.     Cholecalciferol (VITAMIN D3) 125 MCG (5000 UT) CAPS Take 1 capsule by mouth daily.     co-enzyme Q-10 30 MG capsule Take 30 mg by mouth 3 (three) times daily.     diclofenac Sodium (VOLTAREN) 1 % GEL Apply a small grape sized dollop to sore place on foot 3 times daily as needed. 150 g 2   ezetimibe (ZETIA) 10 MG tablet Take 1 tablet (10 mg total) by mouth daily. 90 tablet 3   Omega-3 Fatty Acids (FISH OIL PO) Take 1 tablet by mouth daily. 2 capsules twice a day     rosuvastatin (CRESTOR) 10 MG tablet Take 1 tablet (10 mg  total) by mouth daily. 90 tablet 3   warfarin (COUMADIN) 5 MG tablet TAKE 7.5 MG BY MOUTH ON TUEDAY, THURSDAY, SATURDAY AND SUNDAY 10 MG ON MONDAY, WEDNESDAY, FRIDAY (Patient taking differently: Take 5 mg by mouth See admin instructions. TAKE 7.5 MG BY MOUTH ON TUEDAY, THURSDAY, SATURDAY AND SUNDAY 10 MG ON MONDAY, WEDNESDAY, FRIDAY) 192 tablet 3   IBU 800 MG tablet Take by mouth. (Patient not taking: Reported on 09/10/2021)     No facility-administered medications prior to visit.    Allergies  Allergen Reactions   Pollen Extract Itching    ROS Review of Systems  Constitutional:  Negative for chills, diaphoresis, fatigue, fever and unexpected weight change.  HENT:  Positive for dental problem.   Eyes:  Negative for photophobia and visual disturbance.  Respiratory: Negative.    Cardiovascular: Negative.   Gastrointestinal:  Negative for anal bleeding and blood in stool.  Genitourinary:  Positive for difficulty urinating. Negative for frequency, hematuria and urgency.  Musculoskeletal:  Negative for gait problem and joint swelling.  Neurological:  Negative for dizziness, speech difficulty, weakness and light-headedness.  Hematological: Negative.      Objective:    Physical Exam Vitals and nursing note reviewed.  Constitutional:      General: He is not in acute distress.    Appearance: Normal appearance. He is not ill-appearing, toxic-appearing or diaphoretic.  HENT:     Head: Normocephalic and atraumatic.     Right Ear: External ear normal.     Mouth/Throat:     Mouth: Mucous membranes are moist.     Pharynx: Oropharynx is clear. No oropharyngeal exudate or posterior oropharyngeal erythema.   Eyes:     General: No scleral icterus.       Right eye: No discharge.        Left eye: No discharge.     Conjunctiva/sclera: Conjunctivae normal.     Pupils: Pupils are equal, round, and reactive to light.  Cardiovascular:     Rate and Rhythm: Normal rate and regular rhythm.   Pulmonary:     Effort: Pulmonary effort is normal.     Breath sounds: Normal breath sounds.  Abdominal:     General: Bowel sounds are normal.  Musculoskeletal:     Cervical back: Normal range of motion and neck supple.  Skin:    General: Skin is warm and dry.  Neurological:     Mental Status: He is alert and oriented to person, place, and time.  Psychiatric:  Mood and Affect: Mood normal.        Behavior: Behavior normal.    BP 122/66 (BP Location: Right Arm, Patient Position: Sitting, Cuff Size: Normal)    Pulse 72    Temp 97.7 F (36.5 C) (Temporal)    Ht '5\' 6"'  (1.676 m)    Wt 182 lb 3.2 oz (82.6 kg)    SpO2 97%    BMI 29.41 kg/m  Wt Readings from Last 3 Encounters:  09/10/21 182 lb 3.2 oz (82.6 kg)  08/18/21 185 lb 6.4 oz (84.1 kg)  07/07/21 180 lb (81.6 kg)     Health Maintenance Due  Topic Date Due   HIV Screening  Never done   Hepatitis C Screening  Never done    There are no preventive care reminders to display for this patient.  No results found for: TSH Lab Results  Component Value Date   WBC 5.9 06/24/2021   HGB 14.2 06/24/2021   HCT 42.2 06/24/2021   MCV 85 06/24/2021   PLT 203 06/24/2021   Lab Results  Component Value Date   NA 137 06/24/2021   K 3.9 06/24/2021   CO2 25 06/24/2021   GLUCOSE 87 06/24/2021   BUN 15 06/24/2021   CREATININE 0.96 06/24/2021   BILITOT 0.3 06/24/2021   ALKPHOS 68 06/24/2021   AST 26 06/24/2021   ALT 29 06/24/2021   PROT 6.7 06/24/2021   ALBUMIN 4.7 06/24/2021   CALCIUM 9.4 06/24/2021   EGFR 98 06/24/2021   GFR 94.14 02/24/2021   Lab Results  Component Value Date   CHOL 105 06/24/2021   Lab Results  Component Value Date   HDL 39 (L) 06/24/2021   Lab Results  Component Value Date   LDLCALC 47 06/24/2021   Lab Results  Component Value Date   TRIG 101 06/24/2021   Lab Results  Component Value Date   CHOLHDL 2.7 06/24/2021   Lab Results  Component Value Date   HGBA1C 6.1 02/24/2021       Assessment & Plan:   Problem List Items Addressed This Visit       Other   Drug interaction   Benign prostatic hyperplasia with nocturia - Primary   Relevant Medications   tamsulosin (FLOMAX) 0.4 MG CAPS capsule    Meds ordered this encounter  Medications   tamsulosin (FLOMAX) 0.4 MG CAPS capsule    Sig: Take 1 capsule (0.4 mg total) by mouth daily.    Dispense:  90 capsule    Refill:  1    Follow-up: Return Restart Warfarin Saturday., for See you at planned physical for June/July. .   Information was given about BPH and tamsulosin.  We will follow-up as planned in June or July for physical and BPH/ED. Libby Maw, MD

## 2021-09-11 ENCOUNTER — Other Ambulatory Visit: Payer: Self-pay

## 2021-09-11 ENCOUNTER — Ambulatory Visit (INDEPENDENT_AMBULATORY_CARE_PROVIDER_SITE_OTHER): Payer: BC Managed Care – PPO

## 2021-09-11 DIAGNOSIS — Z7901 Long term (current) use of anticoagulants: Secondary | ICD-10-CM

## 2021-09-11 DIAGNOSIS — Z5181 Encounter for therapeutic drug level monitoring: Secondary | ICD-10-CM | POA: Diagnosis not present

## 2021-09-11 DIAGNOSIS — Z952 Presence of prosthetic heart valve: Secondary | ICD-10-CM

## 2021-09-11 LAB — POCT INR: INR: 1.1 — AB (ref 2.0–3.0)

## 2021-09-11 NOTE — Patient Instructions (Signed)
Continue taking 1 tablet daily except 1.5 tablets each Sunday, Wednesday and Saturday Next INR in 1 week. Call clinic at [phone] for changes in medication or procedures;  missed 1/18 and 1/19 Dental work/bleeding

## 2021-09-18 ENCOUNTER — Ambulatory Visit (INDEPENDENT_AMBULATORY_CARE_PROVIDER_SITE_OTHER): Payer: BC Managed Care – PPO

## 2021-09-18 ENCOUNTER — Other Ambulatory Visit: Payer: Self-pay

## 2021-09-18 DIAGNOSIS — Z7901 Long term (current) use of anticoagulants: Secondary | ICD-10-CM | POA: Diagnosis not present

## 2021-09-18 DIAGNOSIS — Z952 Presence of prosthetic heart valve: Secondary | ICD-10-CM

## 2021-09-18 DIAGNOSIS — Z5181 Encounter for therapeutic drug level monitoring: Secondary | ICD-10-CM | POA: Diagnosis not present

## 2021-09-18 LAB — POCT INR: INR: 1.6 — AB (ref 2.0–3.0)

## 2021-09-18 NOTE — Patient Instructions (Signed)
Continue taking 1 tablet daily except 1.5 tablets each Sunday, Wednesday and Saturday Next INR in 6 weeks. Call clinic at [phone] for changes in medication or procedures;

## 2021-10-29 ENCOUNTER — Other Ambulatory Visit: Payer: Self-pay

## 2021-10-29 ENCOUNTER — Ambulatory Visit (INDEPENDENT_AMBULATORY_CARE_PROVIDER_SITE_OTHER): Payer: BC Managed Care – PPO

## 2021-10-29 DIAGNOSIS — Z952 Presence of prosthetic heart valve: Secondary | ICD-10-CM | POA: Diagnosis not present

## 2021-10-29 DIAGNOSIS — Z5181 Encounter for therapeutic drug level monitoring: Secondary | ICD-10-CM | POA: Diagnosis not present

## 2021-10-29 DIAGNOSIS — Z7901 Long term (current) use of anticoagulants: Secondary | ICD-10-CM | POA: Diagnosis not present

## 2021-10-29 LAB — POCT INR: INR: 2.1 (ref 2.0–3.0)

## 2021-10-29 NOTE — Patient Instructions (Signed)
Continue taking 1 tablet daily except 1.5 tablets each Sunday, Wednesday and Saturday ?Next INR in 6 weeks. Call clinic at [phone] for changes in medication or procedures;  ?

## 2021-11-16 ENCOUNTER — Encounter: Payer: Self-pay | Admitting: Family Medicine

## 2021-11-20 ENCOUNTER — Ambulatory Visit: Payer: BC Managed Care – PPO | Admitting: Family Medicine

## 2021-11-20 ENCOUNTER — Encounter: Payer: Self-pay | Admitting: Family Medicine

## 2021-11-20 VITALS — BP 120/68 | HR 64 | Temp 97.0°F | Ht 66.0 in | Wt 183.6 lb

## 2021-11-20 DIAGNOSIS — C439 Malignant melanoma of skin, unspecified: Secondary | ICD-10-CM | POA: Diagnosis not present

## 2021-11-20 DIAGNOSIS — S90222A Contusion of left lesser toe(s) with damage to nail, initial encounter: Secondary | ICD-10-CM | POA: Diagnosis not present

## 2021-11-20 NOTE — Progress Notes (Signed)
? ?Established Patient Office Visit ? ?Subjective:  ?Patient ID: Larry Parks, male    DOB: March 31, 1976  Age: 46 y.o. MRN: [id number] ? ?CC:  ?Chief Complaint  ?Patient presents with  ? Nail Problem  ?  Brown spot on both great toes x 3-4 weeks, no pains.   ? ? ?HPI ?Larry Parks presents for evaluation of a dark spot Larry Parks noticed under the toenail of his left great toe.  There has been no trauma to the toe.  There is no family history of skin cancer or melanoma.  Larry Parks is anticoagulated with warfarin status post aortic valve replacement.  Larry Parks is compliant with his PT and INR checks. ? ?Past Medical History:  ?Diagnosis Date  ? Allergy   ? Angioedema of lips 10/22/2019  ? Anxiety   ? Depression   ? Dissecting aortic aneurysm (any part), thoracic   ? 2019  ? Dyslipidemia 03/12/2019  ? Hepatic steatosis 11/03/2017  ? Formatting of this note might be different from the original. Noted on CT chest obtained 11/03/2017  ? Hyperlipidemia   ? Hypertension   ? Hypertriglyceridemia 04/23/2020  ? Other allergic rhinitis 11/14/2019  ? Pruritic rash 11/14/2019  ? Sleep apnea   ? uses a CPAP  ? Status post aortic valve replacement 03/12/2019  ? Status post ascending aortic aneurysm repair 12/03/2017  ? Formatting of this note might be different from the original. 11/29/2017 Aortic root replacement with #23 OnX mechanical composite prosthesis with coronary reconstruction and graft replacement of ascending aorta with #26 Hemashield (Dr.Rowe).  ? 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/2019  ? ? ?Past Surgical History:  ?Procedure Laterality Date  ? aortic valve replaced  11/2017  ? aortic aneurism  ? ? ?Family History  ?Problem Relation Age of Onset  ? Diabetes Father   ? Heart attack Father   ? Colon cancer Neg Hx   ? Esophageal cancer Neg Hx   ? Rectal cancer Neg Hx   ? Stomach cancer Neg Hx   ? Prostate cancer Neg Hx   ? ? ?Social History  ? ?Socioeconomic History  ? Marital status: Married  ?  Spouse name: Not on file  ? Number of  children: 2  ? Years of education: Not on file  ? Highest education level: Not on file  ?Occupational History  ? Occupation: Assoc. prof. @ [employer]  ?Tobacco Use  ? Smoking status: Former  ?  Types: Cigarettes  ?  Quit date: 2004  ?  Years since quitting: 19.2  ? Smokeless tobacco: Never  ?Vaping Use  ? Vaping Use: Never used  ?Substance and Sexual Activity  ? Alcohol use: Not Currently  ?  Comment: socially - last 2008  ? Drug use: Never  ? Sexual activity: Not on file  ?Other Topics Concern  ? Not on file  ?Social History Narrative  ? Caffeine: 2 cups/day  ? ?Social Determinants of Health  ? ?Financial Resource Strain: Not on file  ?Food Insecurity: Not on file  ?Transportation Needs: Not on file  ?Physical Activity: Not on file  ?Stress: Not on file  ?Social Connections: Not on file  ?Intimate Partner Violence: Not on file  ? ? ?Outpatient Medications Prior to Visit  ?Medication Sig Dispense Refill  ? Cholecalciferol (VITAMIN D3) 125 MCG (5000 UT) CAPS Take 1 capsule by mouth daily.    ? co-enzyme Q-10 30 MG capsule Take 30 mg by mouth 3 (three) times daily.    ?  diclofenac Sodium (VOLTAREN) 1 % GEL Apply a small grape sized dollop to sore place on foot 3 times daily as needed. 150 g 2  ? ezetimibe (ZETIA) 10 MG tablet Take 1 tablet (10 mg total) by mouth daily. 90 tablet 3  ? Omega-3 Fatty Acids (FISH OIL PO) Take 1 tablet by mouth daily. 2 capsules twice a day    ? rosuvastatin (CRESTOR) 10 MG tablet Take 1 tablet (10 mg total) by mouth daily. 90 tablet 3  ? tamsulosin (FLOMAX) 0.4 MG CAPS capsule Take 1 capsule (0.4 mg total) by mouth daily. 90 capsule 1  ? warfarin (COUMADIN) 5 MG tablet TAKE 7.5 MG BY MOUTH ON TUEDAY, THURSDAY, SATURDAY AND SUNDAY 10 MG ON MONDAY, WEDNESDAY, FRIDAY (Patient taking differently: Take 5 mg by mouth See admin instructions. TAKE 7.5 MG BY MOUTH ON TUEDAY, THURSDAY, SATURDAY AND SUNDAY 10 MG ON MONDAY, WEDNESDAY, FRIDAY) 192 tablet 3  ? amoxicillin (AMOXIL) 500 MG capsule Take by  mouth.    ? IBU 800 MG tablet Take by mouth. (Patient not taking: Reported on 09/10/2021)    ? ?No facility-administered medications prior to visit.  ? ? ?Allergies  ?Allergen Reactions  ? Pollen Extract Itching  ? ? ?ROS ?Review of Systems  ?Constitutional:  Negative for chills, diaphoresis, fatigue, fever and unexpected weight change.  ?Respiratory: Negative.    ?Cardiovascular: Negative.   ?Gastrointestinal: Negative.   ?Skin:  Positive for color change.  ? ?  ?Objective:  ?  ?Physical Exam ?Vitals and nursing note reviewed.  ?Constitutional:   ?   Appearance: Normal appearance.  ?HENT:  ?   Head: Normocephalic and atraumatic.  ?Eyes:  ?   General: No scleral icterus.    ?   Right eye: No discharge.     ?   Left eye: No discharge.  ?   Extraocular Movements: Extraocular movements intact.  ?   Conjunctiva/sclera: Conjunctivae normal.  ?Pulmonary:  ?   Effort: Pulmonary effort is normal.  ?Skin: ?   General: Skin is warm and dry.  ? ?    ?Neurological:  ?   Mental Status: Larry Parks is alert and oriented to person, place, and time.  ?Psychiatric:     ?   Mood and Affect: Mood normal.     ?   Behavior: Behavior normal.  ? ? ?BP 120/68 (BP Location: Right Arm, Patient Position: Sitting, Cuff Size: Normal)   Pulse 64   Temp (!) 97 ?F (36.1 ?C) (Temporal)   Ht '5\' 6"'  (1.676 m)   Wt 183 lb 9.6 oz (83.3 kg)   SpO2 98%   BMI 29.63 kg/m?  ?Wt Readings from Last 3 Encounters:  ?11/20/21 183 lb 9.6 oz (83.3 kg)  ?09/10/21 182 lb 3.2 oz (82.6 kg)  ?08/18/21 185 lb 6.4 oz (84.1 kg)  ? ? ? ?Health Maintenance Due  ?Topic Date Due  ? HIV Screening  Never done  ? Hepatitis C Screening  Never done  ? ? ?There are no preventive care reminders to display for this patient. ? ?No results found for: TSH ?Lab Results  ?Component Value Date  ? WBC 5.9 06/24/2021  ? HGB 14.2 06/24/2021  ? HCT 42.2 06/24/2021  ? MCV 85 06/24/2021  ? PLT 203 06/24/2021  ? ?Lab Results  ?Component Value Date  ? NA 137 06/24/2021  ? K 3.9 06/24/2021  ? CO2 25  06/24/2021  ? GLUCOSE 87 06/24/2021  ? BUN 15 06/24/2021  ? CREATININE 0.96 06/24/2021  ?  BILITOT 0.3 06/24/2021  ? ALKPHOS 68 06/24/2021  ? AST 26 06/24/2021  ? ALT 29 06/24/2021  ? PROT 6.7 06/24/2021  ? ALBUMIN 4.7 06/24/2021  ? CALCIUM 9.4 06/24/2021  ? EGFR 98 06/24/2021  ? GFR 94.14 02/24/2021  ? ?Lab Results  ?Component Value Date  ? CHOL 105 06/24/2021  ? ?Lab Results  ?Component Value Date  ? HDL 39 (L) 06/24/2021  ? ?Lab Results  ?Component Value Date  ? [place] 47 06/24/2021  ? ?Lab Results  ?Component Value Date  ? TRIG 101 06/24/2021  ? ?Lab Results  ?Component Value Date  ? CHOLHDL 2.7 06/24/2021  ? ?Lab Results  ?Component Value Date  ? HGBA1C 6.1 02/24/2021  ? ? ?  ?Assessment & Plan:  ? ?Problem List Items Addressed This Visit   ? ?  ? Musculoskeletal and Integument  ? 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 Relevant Orders  ? Ambulatory referral to Dermatology  ? ?Other Visit Diagnoses   ? ? Subungual hematoma of foot, left, initial encounter      ? ?  ? ? ?No orders of the defined types were placed in this encounter. ? ? ?Follow-up: Return if symptoms worsen or fail to improve.  ? ?Probable subungual hematoma but cannot rule out subungual melanoma.  Requested emergent dermatology consultation. ?Libby Maw, MD ?

## 2021-12-07 ENCOUNTER — Encounter: Payer: Self-pay | Admitting: Family Medicine

## 2021-12-14 ENCOUNTER — Ambulatory Visit (INDEPENDENT_AMBULATORY_CARE_PROVIDER_SITE_OTHER): Payer: BC Managed Care – PPO

## 2021-12-14 DIAGNOSIS — Z952 Presence of prosthetic heart valve: Secondary | ICD-10-CM

## 2021-12-14 DIAGNOSIS — Z5181 Encounter for therapeutic drug level monitoring: Secondary | ICD-10-CM | POA: Diagnosis not present

## 2021-12-14 DIAGNOSIS — Z7901 Long term (current) use of anticoagulants: Secondary | ICD-10-CM | POA: Diagnosis not present

## 2021-12-14 LAB — POCT INR: INR: 2.4 (ref 2.0–3.0)

## 2021-12-14 NOTE — Patient Instructions (Signed)
Continue taking 1 tablet daily except 1.5 tablets each Sunday, Wednesday and Saturday ?Next INR in 6 weeks. Call clinic at [phone] for changes in medication or procedures; Eat greens tonight; ?

## 2022-01-28 ENCOUNTER — Ambulatory Visit (INDEPENDENT_AMBULATORY_CARE_PROVIDER_SITE_OTHER): Payer: BC Managed Care – PPO

## 2022-01-28 ENCOUNTER — Other Ambulatory Visit: Payer: Self-pay | Admitting: Family Medicine

## 2022-01-28 DIAGNOSIS — Z952 Presence of prosthetic heart valve: Secondary | ICD-10-CM | POA: Diagnosis not present

## 2022-01-28 DIAGNOSIS — Z5181 Encounter for therapeutic drug level monitoring: Secondary | ICD-10-CM | POA: Diagnosis not present

## 2022-01-28 DIAGNOSIS — Z7901 Long term (current) use of anticoagulants: Secondary | ICD-10-CM

## 2022-01-28 LAB — POCT INR: INR: 1.7 — AB (ref 2.0–3.0)

## 2022-01-28 NOTE — Patient Instructions (Signed)
Continue taking 1 tablet daily except 1.5 tablets each Sunday, Wednesday and Saturday Next INR in 6 weeks. Call clinic at [phone] for changes in medication or procedures;

## 2022-01-28 NOTE — Addendum Note (Signed)
Addended by: Jon Billings on: 01/28/2022 01:11 PM   Modules accepted: Orders

## 2022-02-25 ENCOUNTER — Ambulatory Visit (INDEPENDENT_AMBULATORY_CARE_PROVIDER_SITE_OTHER): Payer: BC Managed Care – PPO | Admitting: Family Medicine

## 2022-02-25 ENCOUNTER — Encounter: Payer: Self-pay | Admitting: Family Medicine

## 2022-02-25 VITALS — BP 110/60 | HR 64 | Temp 97.1°F | Ht 66.0 in | Wt 177.0 lb

## 2022-02-25 DIAGNOSIS — N401 Enlarged prostate with lower urinary tract symptoms: Secondary | ICD-10-CM

## 2022-02-25 DIAGNOSIS — R351 Nocturia: Secondary | ICD-10-CM | POA: Diagnosis not present

## 2022-02-25 DIAGNOSIS — E785 Hyperlipidemia, unspecified: Secondary | ICD-10-CM | POA: Diagnosis not present

## 2022-02-25 DIAGNOSIS — Z0000 Encounter for general adult medical examination without abnormal findings: Secondary | ICD-10-CM | POA: Diagnosis not present

## 2022-02-25 LAB — COMPREHENSIVE METABOLIC PANEL
ALT: 21 U/L (ref 0–53)
AST: 22 U/L (ref 0–37)
Albumin: 4.4 g/dL (ref 3.5–5.2)
Alkaline Phosphatase: 47 U/L (ref 39–117)
BUN: 12 mg/dL (ref 6–23)
CO2: 28 mEq/L (ref 19–32)
Calcium: 9 mg/dL (ref 8.4–10.5)
Chloride: 104 mEq/L (ref 96–112)
Creatinine, Ser: 0.85 mg/dL (ref 0.40–1.50)
GFR: 102.76 mL/min (ref >60.00)
Glucose, Bld: 90 mg/dL (ref 70–99)
Potassium: 3.8 mEq/L (ref 3.5–5.1)
Sodium: 138 mEq/L (ref 135–145)
Total Bilirubin: 0.4 mg/dL (ref 0.2–1.2)
Total Protein: 6.7 g/dL (ref 6.0–8.3)

## 2022-02-25 LAB — CBC
HCT: 40.1 % (ref 39.0–52.0)
Hemoglobin: 13.4 g/dL (ref 13.0–17.0)
MCHC: 33.5 g/dL (ref 30.0–36.0)
MCV: 83.6 fl (ref 78.0–100.0)
Platelets: 172 10*3/uL (ref 150.0–400.0)
RBC: 4.79 Mil/uL (ref 4.22–5.81)
RDW: 13.8 % (ref 11.5–15.5)
WBC: 5.6 10*3/uL (ref 4.0–10.5)

## 2022-02-25 LAB — LIPID PANEL
Cholesterol: 91 mg/dL (ref 0–200)
HDL: 35.4 mg/dL — ABNORMAL LOW (ref >39.00)
LDL Cholesterol: 35 mg/dL (ref 0–99)
NonHDL: 55.87
Total CHOL/HDL Ratio: 3
Triglycerides: 103 mg/dL (ref 0.0–149.0)
VLDL: 20.6 mg/dL (ref 0.0–40.0)

## 2022-02-25 LAB — URINALYSIS, ROUTINE W REFLEX MICROSCOPIC
Bilirubin Urine: NEGATIVE
Hgb urine dipstick: NEGATIVE
Ketones, ur: NEGATIVE
Leukocytes,Ua: NEGATIVE
Nitrite: NEGATIVE
RBC / HPF: NONE SEEN (ref 0–?)
Specific Gravity, Urine: <=1.005 — AB (ref 1.000–1.030)
Total Protein, Urine: NEGATIVE
Urine Glucose: NEGATIVE
Urobilinogen, UA: 0.2 (ref 0.0–1.0)
WBC, UA: NONE SEEN (ref 0–?)
pH: 7 (ref 5.0–8.0)

## 2022-02-25 LAB — PSA: PSA: 0.45 ng/mL (ref 0.10–4.00)

## 2022-02-25 MED ORDER — TAMSULOSIN HCL 0.4 MG PO CAPS: 0.4000 mg | ORAL_CAPSULE | Freq: Every day | ORAL | 3 refills | Status: DC

## 2022-02-25 NOTE — Progress Notes (Addendum)
Established Patient Office Visit  Subjective   Patient ID: Larry Parks, male    DOB: 14-Feb-1976  Age: 46 y.o. MRN: [id number]  Chief Complaint  Patient presents with   Annual Exam    CPE Pt fasting No concerns     HPI for physical exam and follow-up of mixed hyperlipidemia.  Continues with rosuvastatin without issue.  Has been going for regular dental care for recent dental work.  He is active around his house.  Continues with the Coumadin clinic status post aortic valve replacement.  Blood pressure has normalized.  Tamsulosin has made a big difference in his urine flow.  Currently not having any troubles with erectile dysfunction.    Review of Systems  Constitutional: Negative.   HENT: Negative.    Eyes:  Negative for blurred vision, discharge and redness.  Respiratory: Negative.    Cardiovascular: Negative.   Gastrointestinal:  Negative for abdominal pain.  Genitourinary: Negative.   Musculoskeletal: Negative.  Negative for myalgias.  Skin:  Negative for rash.  Neurological:  Negative for tingling, loss of consciousness and weakness.  Endo/Heme/Allergies:  Negative for polydipsia.      09/28/2022    3:57 PM 04/02/2022    2:32 PM 02/25/2022   11:53 AM  Depression screen PHQ 2/9  Decreased Interest 0 0 0  Down, Depressed, Hopeless 0 0 0  PHQ - 2 Score 0 0 0  Altered sleeping   0  Tired, decreased energy   0  Change in appetite   0  Feeling bad or failure about yourself    0  Trouble concentrating   0  Moving slowly or fidgety/restless   0  Suicidal thoughts   0  PHQ-9 Score   0  Difficult doing work/chores  Not difficult at all Not difficult at all       Objective:     BP 110/60 (BP Location: Right Arm, Patient Position: Sitting, Cuff Size: Normal)   Pulse 64   Temp (!) 97.1 F (36.2 C) (Temporal)   Ht '5\' 6"'$  (1.676 m)   Wt 177 lb (80.3 kg)   SpO2 97%   BMI 28.57 kg/m  Wt Readings from Last 3 Encounters:  09/28/22 183 lb (83 kg)  08/25/22 181 lb (82.1  kg)  04/02/22 179 lb 6.4 oz (81.4 kg)      Physical Exam Constitutional:      General: He is not in acute distress.    Appearance: Normal appearance. He is not ill-appearing, toxic-appearing or diaphoretic.  HENT:     Head: Normocephalic and atraumatic.     Right Ear: External ear normal.     Left Ear: External ear normal.     Mouth/Throat:     Mouth: Mucous membranes are moist.     Pharynx: Oropharynx is clear. No oropharyngeal exudate or posterior oropharyngeal erythema.  Eyes:     General: No scleral icterus.       Right eye: No discharge.        Left eye: No discharge.     Extraocular Movements: Extraocular movements intact.     Conjunctiva/sclera: Conjunctivae normal.     Pupils: Pupils are equal, round, and reactive to light.  Cardiovascular:     Rate and Rhythm: Normal rate and regular rhythm.  Pulmonary:     Effort: Pulmonary effort is normal. No respiratory distress.     Breath sounds: Normal breath sounds.  Abdominal:     General: Bowel sounds are normal.  Tenderness: There is no abdominal tenderness. There is no guarding.     Hernia: A hernia (small umbilical hernia) is present. There is no hernia in the left inguinal area or right inguinal area.  Genitourinary:    Penis: Circumcised. No hypospadias, erythema, tenderness, discharge, swelling or lesions.      Testes:        Right: Mass, tenderness or swelling not present. Right testis is descended.        Left: Mass, tenderness or swelling not present. Left testis is descended.     Epididymis:     Right: Not inflamed or enlarged.     Left: Not inflamed or enlarged.  Musculoskeletal:     Cervical back: No rigidity or tenderness.  Lymphadenopathy:     Lower Body: No right inguinal adenopathy. No left inguinal adenopathy.  Skin:    General: Skin is warm and dry.  Neurological:     Mental Status: He is alert and oriented to person, place, and time.  Psychiatric:        Mood and Affect: Mood normal.         Behavior: Behavior normal.      Results for orders placed or performed in visit on 02/25/22  CBC  Result Value Ref Range   WBC 5.6 4.0 - 10.5 K/uL   RBC 4.79 4.22 - 5.81 Mil/uL   Platelets 172.0 150.0 - 400.0 K/uL   Hemoglobin 13.4 13.0 - 17.0 g/dL   HCT 40.1 39.0 - 52.0 %   MCV 83.6 78.0 - 100.0 fl   MCHC 33.5 30.0 - 36.0 g/dL   RDW 13.8 11.5 - 15.5 %  Comprehensive metabolic panel  Result Value Ref Range   Sodium 138 135 - 145 mEq/L   Potassium 3.8 3.5 - 5.1 mEq/L   Chloride 104 96 - 112 mEq/L   CO2 28 19 - 32 mEq/L   Glucose, Bld 90 70 - 99 mg/dL   BUN 12 6 - 23 mg/dL   Creatinine, Ser 0.85 0.40 - 1.50 mg/dL   Total Bilirubin 0.4 0.2 - 1.2 mg/dL   Alkaline Phosphatase 47 39 - 117 U/L   AST 22 0 - 37 U/L   ALT 21 0 - 53 U/L   Total Protein 6.7 6.0 - 8.3 g/dL   Albumin 4.4 3.5 - 5.2 g/dL   GFR 102.76 >60.00 mL/min   Calcium 9.0 8.4 - 10.5 mg/dL  Lipid panel  Result Value Ref Range   Cholesterol 91 0 - 200 mg/dL   Triglycerides 103.0 0.0 - 149.0 mg/dL   HDL 35.40 (L) >39.00 mg/dL   VLDL 20.6 0.0 - 40.0 mg/dL   LDL Cholesterol 35 0 - 99 mg/dL   Total CHOL/HDL Ratio 3    NonHDL 55.87   PSA  Result Value Ref Range   PSA 0.45 0.10 - 4.00 ng/mL  Urinalysis, Routine w reflex microscopic  Result Value Ref Range   Color, Urine YELLOW Yellow;Lt. Yellow;Straw;Dark Yellow;Amber;Green;Red;Brown   APPearance CLEAR Clear;Turbid;Slightly Cloudy;Cloudy   Specific Gravity, Urine <=1.005 (A) 1.000 - 1.030   pH 7.0 5.0 - 8.0   Total Protein, Urine NEGATIVE Negative   Urine Glucose NEGATIVE Negative   Ketones, ur NEGATIVE Negative   Bilirubin Urine NEGATIVE Negative   Hgb urine dipstick NEGATIVE Negative   Urobilinogen, UA 0.2 0.0 - 1.0   Leukocytes,Ua NEGATIVE Negative   Nitrite NEGATIVE Negative   WBC, UA none seen 0-2/hpf   RBC / HPF none seen 0-2/hpf  The ASCVD Risk score (Arnett DK, et al., 2019) failed to calculate for the following reasons:   The valid total  cholesterol range is 130 to 320 mg/dL    Assessment & Plan:   Problem List Items Addressed This Visit       Other   Dyslipidemia   Relevant Orders   Comprehensive metabolic panel (Completed)   Lipid panel (Completed)   Benign prostatic hyperplasia with nocturia   Relevant Medications   tamsulosin (FLOMAX) 0.4 MG CAPS capsule   Other Relevant Orders   PSA (Completed)   Other Visit Diagnoses     Healthcare maintenance    -  Primary   Relevant Orders   CBC (Completed)   Urinalysis, Routine w reflex microscopic (Completed)       Return in about 1 year (around 02/26/2023), or if symptoms worsen or fail to improve.  Continue tamsulosin and rosuvastatin.  Information was given about BPH and preventing high cholesterol as well as health maintenance and preventative care.  Up-to-date on health maintenance issues.  Libby Maw, MD

## 2022-03-11 ENCOUNTER — Ambulatory Visit (INDEPENDENT_AMBULATORY_CARE_PROVIDER_SITE_OTHER): Payer: BC Managed Care – PPO

## 2022-03-11 ENCOUNTER — Encounter: Payer: Self-pay | Admitting: Family Medicine

## 2022-03-11 DIAGNOSIS — Z7901 Long term (current) use of anticoagulants: Secondary | ICD-10-CM | POA: Diagnosis not present

## 2022-03-11 DIAGNOSIS — Z952 Presence of prosthetic heart valve: Secondary | ICD-10-CM | POA: Diagnosis not present

## 2022-03-11 LAB — POCT INR: INR: 1.6 — AB (ref 2.0–3.0)

## 2022-03-11 NOTE — Patient Instructions (Signed)
Description   Continue taking 1 tablet daily except 1.5 tablets each Sunday, Wednesday and Saturday Next INR in 6 weeks. Call clinic at [phone] for changes in medication or procedures;

## 2022-04-02 ENCOUNTER — Ambulatory Visit (INDEPENDENT_AMBULATORY_CARE_PROVIDER_SITE_OTHER): Payer: BC Managed Care – PPO

## 2022-04-02 ENCOUNTER — Ambulatory Visit (INDEPENDENT_AMBULATORY_CARE_PROVIDER_SITE_OTHER): Payer: BC Managed Care – PPO | Admitting: Family Medicine

## 2022-04-02 ENCOUNTER — Encounter: Payer: Self-pay | Admitting: Family Medicine

## 2022-04-02 VITALS — BP 132/64 | HR 81 | Temp 98.2°F | Ht 66.0 in | Wt 179.4 lb

## 2022-04-02 DIAGNOSIS — B349 Viral infection, unspecified: Secondary | ICD-10-CM

## 2022-04-02 NOTE — Progress Notes (Signed)
Established Patient Office Visit  Subjective   Patient ID: Larry Parks, male    DOB: 05/21/1976  Age: 46 y.o. MRN: [id number]  Chief Complaint  Patient presents with   Cough    Cough, fever, body aches symptoms x 3 days. At home covid test negative. Left ear pain today     Cough Associated symptoms include a fever. Pertinent negatives include no eye redness, headaches, myalgias, rash, shortness of breath or wheezing.   3-day history of nasal congestion postnasal drip, scratchy throat, fevers up to 102, cough.  There has been fatigue and malaise.  Denies wheezing or difficulty breathing.  No asthma history.  Denies myalgias neuralgias.  COVID test was negative.  Seen in urgent care yesterday where provider was concerned about wheezing   Review of Systems  Constitutional:  Positive for fever and malaise/fatigue. Negative for diaphoresis.  HENT:  Positive for congestion.   Eyes:  Negative for blurred vision, discharge and redness.  Respiratory:  Positive for cough. Negative for sputum production, shortness of breath and wheezing.   Cardiovascular: Negative.   Gastrointestinal:  Negative for abdominal pain.  Genitourinary: Negative.   Musculoskeletal: Negative.  Negative for joint pain and myalgias.  Skin:  Negative for rash.  Neurological:  Negative for tingling, loss of consciousness, weakness and headaches.  Endo/Heme/Allergies:  Negative for polydipsia.      Objective:     BP 132/64 (BP Location: Right Arm, Patient Position: Sitting, Cuff Size: Normal)   Pulse 81   Temp 98.2 F (36.8 C) (Temporal)   Ht '5\' 6"'$  (1.676 m)   Wt 179 lb 6.4 oz (81.4 kg)   SpO2 98%   BMI 28.96 kg/m    Physical Exam Constitutional:      General: He is not in acute distress.    Appearance: Normal appearance. He is not ill-appearing, toxic-appearing or diaphoretic.  HENT:     Head: Normocephalic and atraumatic.     Right Ear: Ear canal and external ear normal.     Left Ear: External ear  normal. There is impacted cerumen.     Nose: Congestion present.     Mouth/Throat:     Mouth: Mucous membranes are moist.     Pharynx: Oropharynx is clear. No oropharyngeal exudate or posterior oropharyngeal erythema.  Eyes:     General: No scleral icterus.       Right eye: No discharge.        Left eye: No discharge.     Extraocular Movements: Extraocular movements intact.     Conjunctiva/sclera: Conjunctivae normal.     Pupils: Pupils are equal, round, and reactive to light.  Cardiovascular:     Rate and Rhythm: Normal rate and regular rhythm.  Pulmonary:     Effort: Pulmonary effort is normal. No respiratory distress.     Breath sounds: Normal breath sounds. No stridor. No wheezing or rales.  Abdominal:     General: Bowel sounds are normal.     Tenderness: There is no guarding.  Musculoskeletal:     Cervical back: No rigidity or tenderness.  Skin:    General: Skin is warm and dry.     Findings: No rash.  Neurological:     Mental Status: He is alert and oriented to person, place, and time.  Psychiatric:        Mood and Affect: Mood normal.        Behavior: Behavior normal.      No results found for any visits on  04/02/22.    The ASCVD Risk score (Arnett DK, et al., 2019) failed to calculate for the following reasons:   The valid total cholesterol range is 130 to 320 mg/dL    Assessment & Plan:   Problem List Items Addressed This Visit       Other   Viral syndrome - Primary   Relevant Orders   Novel Coronavirus, NAA ([facility])   DG Chest 2 View    Return in about 5 days (around 04/07/2022), or Quarantine at home through the weekend.  Return on Tuesday or Wednesday if not improving..  May continue using OTC products and/or Advil fevers   Libby Maw, MD

## 2022-04-02 NOTE — Progress Notes (Unsigned)
Initial visit for 3 day hx of nasal congestion, nasal drip, scratchy throat, fever as high as 102.There has been fatigue and malaise,  No SOB Covid test negative

## 2022-04-03 LAB — SPECIMEN STATUS REPORT

## 2022-04-03 LAB — NOVEL CORONAVIRUS, NAA: SARS-CoV-2, NAA: NOT DETECTED

## 2022-04-06 ENCOUNTER — Ambulatory Visit: Payer: BC Managed Care – PPO | Admitting: Family Medicine

## 2022-04-21 ENCOUNTER — Ambulatory Visit: Payer: BC Managed Care – PPO

## 2022-04-22 ENCOUNTER — Ambulatory Visit: Payer: BC Managed Care – PPO | Attending: Cardiology

## 2022-04-22 DIAGNOSIS — Z952 Presence of prosthetic heart valve: Secondary | ICD-10-CM

## 2022-04-22 DIAGNOSIS — Z7901 Long term (current) use of anticoagulants: Secondary | ICD-10-CM

## 2022-04-22 LAB — POCT INR: INR: 2.2 (ref 2.0–3.0)

## 2022-04-22 NOTE — Patient Instructions (Signed)
Description   Only take 0.5 tablet today and then continue taking 1 tablet daily except 1.5 tablets each Sunday, Wednesday and Saturday Next INR in 5 weeks. Call clinic at [phone] for changes in medication or procedures

## 2022-05-06 ENCOUNTER — Other Ambulatory Visit: Payer: Self-pay | Admitting: Family Medicine

## 2022-05-06 DIAGNOSIS — E785 Hyperlipidemia, unspecified: Secondary | ICD-10-CM

## 2022-05-25 ENCOUNTER — Other Ambulatory Visit: Payer: Self-pay | Admitting: Family Medicine

## 2022-05-27 ENCOUNTER — Ambulatory Visit: Payer: BC Managed Care – PPO | Attending: Cardiology | Admitting: *Deleted

## 2022-05-27 DIAGNOSIS — Z7901 Long term (current) use of anticoagulants: Secondary | ICD-10-CM | POA: Diagnosis not present

## 2022-05-27 DIAGNOSIS — Z952 Presence of prosthetic heart valve: Secondary | ICD-10-CM | POA: Diagnosis not present

## 2022-05-27 LAB — POCT INR: INR: 1.7 — AB (ref 2.0–3.0)

## 2022-05-27 MED ORDER — WARFARIN SODIUM 5 MG PO TABS: ORAL_TABLET | ORAL | 1 refills | Status: DC

## 2022-05-27 NOTE — Patient Instructions (Signed)
Description   Continue taking 1 tablet daily except 1.5 tablets each Sunday, Wednesday and Saturday Next INR in 6 weeks. Call clinic at [phone] for changes in medication or procedures

## 2022-06-01 ENCOUNTER — Telehealth (INDEPENDENT_AMBULATORY_CARE_PROVIDER_SITE_OTHER): Payer: BC Managed Care – PPO | Admitting: Adult Health

## 2022-06-01 DIAGNOSIS — G4733 Obstructive sleep apnea (adult) (pediatric): Secondary | ICD-10-CM

## 2022-06-01 NOTE — Progress Notes (Signed)
PATIENT: Larry Parks DOB: 02-04-1976  REASON FOR VISIT: follow up HISTORY FROM: patient PRIMARY NEUROLOGIST: Dr. Rexene Alberts  Virtual Visit via Video Note  I connected with [employer] on 06/01/22 at  2:45 PM EDT by a video enabled telemedicine application located remotely at [facility] and verified that I am speaking with the correct person using two identifiers who was located at their own home.   I discussed the limitations of evaluation and management by telemedicine and the availability of in person appointments. The patient expressed understanding and agreed to proceed.   PATIENT: Larry Parks DOB: May 22, 1976  REASON FOR VISIT: follow up HISTORY FROM: patient  HISTORY OF PRESENT ILLNESS: Today 06/01/22:  Larry [employer] is a 46 year old male with a history of obstructive sleep apnea on CPAP.  His download indicates that he uses machine nightly for compliance of 100%.  He uses machine greater than 4 hours each night.  His average usage is 7 hours and 18 minutes.  His residual AHI is 4.1 on 8 to 20 cm of water with EPR of 3.  He does report that there are times he feels that the pressure is too high.  Overall he feels that he is doing well.  Returns today for evaluation.  REVIEW OF SYSTEMS: Out of a complete 14 system review of symptoms, the patient complains only of the following symptoms, and all other reviewed systems are negative.  ALLERGIES: Allergies  Allergen Reactions   Pollen Extract Itching    HOME MEDICATIONS: Outpatient Medications Prior to Visit  Medication Sig Dispense Refill   Cholecalciferol (VITAMIN D3) 125 MCG (5000 UT) CAPS Take 1 capsule by mouth daily.     co-enzyme Q-10 30 MG capsule Take 30 mg by mouth 3 (three) times daily.     diclofenac Sodium (VOLTAREN) 1 % GEL Apply a small grape sized dollop to sore place on foot 3 times daily as needed. 150 g 2   ezetimibe (ZETIA) 10 MG tablet Take 1 tablet (10 mg total) by mouth daily.  90 tablet 3   Omega-3 Fatty Acids (FISH OIL PO) Take 1 tablet by mouth daily. 2 capsules twice a day     rosuvastatin (CRESTOR) 10 MG tablet TAKE 1 TABLET (10 MG TOTAL) BY MOUTH DAILY. 90 tablet 3   tamsulosin (FLOMAX) 0.4 MG CAPS capsule Take 1 capsule (0.4 mg total) by mouth daily. 90 capsule 3   warfarin (COUMADIN) 5 MG tablet Take 1 tablet daily except 1.5 tablets each Sunday, Wednesday and Saturday or as directed by Anticoagulation Clinic. 140 tablet 1   No facility-administered medications prior to visit.    PAST MEDICAL HISTORY: Past Medical History:  Diagnosis Date   Allergy    Angioedema of lips 10/22/2019   Anxiety    Depression    Dissecting aortic aneurysm (any part), thoracic ([place])    2019   Dyslipidemia 03/12/2019   Hepatic steatosis 11/03/2017   Formatting of this note might be different from the original. Noted on CT chest obtained 11/03/2017   Hyperlipidemia    Hypertension    Hypertriglyceridemia 04/23/2020   Other allergic rhinitis 11/14/2019   Pruritic rash 11/14/2019   Sleep apnea    uses a CPAP   Status post aortic valve replacement 03/12/2019   Status post ascending aortic aneurysm repair 12/03/2017   Formatting of this note might be different from the original. 11/29/2017 Aortic root replacement with #23 OnX mechanical composite prosthesis with coronary reconstruction and graft replacement of  ascending aorta with #26 Hemashield (Dr.Rowe).   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/2019    PAST SURGICAL HISTORY: Past Surgical History:  Procedure Laterality Date   aortic valve replaced  11/2017   aortic aneurism    FAMILY HISTORY: Family History  Problem Relation Age of Onset   Diabetes Father    Heart attack Father    Colon cancer Neg Hx    Esophageal cancer Neg Hx    Rectal cancer Neg Hx    Stomach cancer Neg Hx    Prostate cancer Neg Hx     SOCIAL HISTORY: Social History   Socioeconomic History   Marital status: Married    Spouse name:  Not on file   Number of children: 2   Years of education: Not on file   Highest education level: Not on file  Occupational History   Occupation: Assoc. prof. @ [employer]  Tobacco Use   Smoking status: Former    Types: Cigarettes    Quit date: 2004    Years since quitting: 19.7   Smokeless tobacco: Never  Vaping Use   Vaping Use: Never used  Substance and Sexual Activity   Alcohol use: Not Currently    Comment: socially - last 2008   Drug use: Never   Sexual activity: Yes    Birth control/protection: Condom  Other Topics Concern   Not on file  Social History Narrative   Caffeine: 2 cups/day   Social Determinants of Health   Financial Resource Strain: Not on file  Food Insecurity: Not on file  Transportation Needs: Not on file  Physical Activity: Not on file  Stress: Not on file  Social Connections: Not on file  Intimate Partner Violence: Not on file      PHYSICAL EXAM Generalized: Well developed, in no acute distress   Neurological examination  Mentation: Alert oriented to time, place, history taking. Follows all commands speech and language fluent Cranial nerve II-XII:Extraocular movements were full. Facial symmetry noted. Head turning and shoulder shrug  were normal and symmetric. Reflexes: UTA  DIAGNOSTIC DATA (LABS, IMAGING, TESTING) - I reviewed patient records, labs, notes, testing and imaging myself where available.  Lab Results  Component Value Date   WBC 5.6 02/25/2022   HGB 13.4 02/25/2022   HCT 40.1 02/25/2022   MCV 83.6 02/25/2022   PLT 172.0 02/25/2022      Component Value Date/Time   NA 138 02/25/2022 1204   NA 137 06/24/2021 1039   K 3.8 02/25/2022 1204   CL 104 02/25/2022 1204   CO2 28 02/25/2022 1204   GLUCOSE 90 02/25/2022 1204   BUN 12 02/25/2022 1204   BUN 15 06/24/2021 1039   CREATININE 0.85 02/25/2022 1204   CALCIUM 9.0 02/25/2022 1204   PROT 6.7 02/25/2022 1204   PROT 6.7 06/24/2021 1039   ALBUMIN 4.4 02/25/2022 1204    ALBUMIN 4.7 06/24/2021 1039   AST 22 02/25/2022 1204   ALT 21 02/25/2022 1204   ALKPHOS 47 02/25/2022 1204   BILITOT 0.4 02/25/2022 1204   BILITOT 0.3 06/24/2021 1039   GFRNONAA 107 04/18/2020 1645   GFRAA 123 04/18/2020 1645   Lab Results  Component Value Date   CHOL 91 02/25/2022   HDL 35.40 (L) 02/25/2022   LDLCALC 35 02/25/2022   TRIG 103.0 02/25/2022   CHOLHDL 3 02/25/2022   Lab Results  Component Value Date   HGBA1C 6.1 02/24/2021       ASSESSMENT AND PLAN 46 y.o.  year old male  has a past medical history of Allergy, Angioedema of lips (10/22/2019), Anxiety, Depression, Dissecting aortic aneurysm (any part), thoracic ([place]), Dyslipidemia (03/12/2019), Hepatic steatosis (11/03/2017), Hyperlipidemia, Hypertension, Hypertriglyceridemia (04/23/2020), Other allergic rhinitis (11/14/2019), Pruritic rash (11/14/2019), Sleep apnea, Status post aortic valve replacement (03/12/2019), Status post ascending aortic aneurysm repair (12/03/2017), and Status post thoracic aortic aneurysm repair (09/17/2019). here with:  OSA on CPAP  CPAP compliance excellent Residual AHI is good Change pressure 8-18 cmH2O EPR 3 Encouraged patient to continue using CPAP nightly and > 4 hours each night F/U in 1 year or sooner if needed   Ward Givens, MSN, NP-C 06/01/2022, 2:03 PM [facility] [address] [place], [place] [address] [phone]

## 2022-06-02 NOTE — Progress Notes (Signed)
Order for pressure change faxed to [facility] [phone]. Received a receipt of confirmation.

## 2022-07-12 ENCOUNTER — Ambulatory Visit: Payer: BC Managed Care – PPO | Attending: Cardiology

## 2022-07-12 DIAGNOSIS — Z952 Presence of prosthetic heart valve: Secondary | ICD-10-CM | POA: Diagnosis not present

## 2022-07-12 DIAGNOSIS — Z7901 Long term (current) use of anticoagulants: Secondary | ICD-10-CM

## 2022-07-12 LAB — POCT INR: INR: 2 (ref 2.0–3.0)

## 2022-07-12 NOTE — Patient Instructions (Signed)
Continue taking 1 tablet daily except 1.5 tablets each Sunday, Wednesday and Saturday Next INR in 7 weeks. Call clinic at [phone] for changes in medication or procedures

## 2022-08-24 ENCOUNTER — Other Ambulatory Visit: Payer: Self-pay | Admitting: Family Medicine

## 2022-08-24 DIAGNOSIS — E785 Hyperlipidemia, unspecified: Secondary | ICD-10-CM

## 2022-08-25 ENCOUNTER — Encounter: Payer: Self-pay | Admitting: Cardiology

## 2022-08-25 ENCOUNTER — Ambulatory Visit: Payer: BC Managed Care – PPO | Attending: Cardiology | Admitting: Cardiology

## 2022-08-25 VITALS — BP 120/82 | HR 86 | Ht 66.0 in | Wt 181.0 lb

## 2022-08-25 DIAGNOSIS — E782 Mixed hyperlipidemia: Secondary | ICD-10-CM | POA: Diagnosis not present

## 2022-08-25 DIAGNOSIS — Z952 Presence of prosthetic heart valve: Secondary | ICD-10-CM

## 2022-08-25 DIAGNOSIS — Z8679 Personal history of other diseases of the circulatory system: Secondary | ICD-10-CM | POA: Diagnosis not present

## 2022-08-25 DIAGNOSIS — Z9889 Other specified postprocedural states: Secondary | ICD-10-CM

## 2022-08-25 NOTE — Patient Instructions (Addendum)
Medication Instructions:  Your physician recommends that you continue on your current medications as directed. Please refer to the Current Medication list given to you today.  *If you need a refill on your cardiac medications before your next appointment, please call your pharmacy*   Lab Work: 2nd [place] recommends that you return for lab work in: Before CT scan.  You can come Monday through Friday 8:00 am to 11:30am and 1:00 to 4:30. CLOSED ON FRIDAY AFTERNOONS. You do not need to make an appointment as the order has already been placed.    Testing/Procedures: Non-Cardiac CT Angiography (CTA), is a special type of CT scan that uses a computer to produce multi-dimensional views of major blood vessels throughout the body. In CT angiography, a contrast material is injected through an IV to help visualize the blood vessels    Follow-Up: At [facility], you and your health needs are our priority.  As part of our continuing mission to provide you with exceptional heart care, we have created designated Provider Care Teams.  These Care Teams include your primary Cardiologist (physician) and Advanced Practice Providers (APPs -  Physician Assistants and Nurse Practitioners) who all work together to provide you with the care you need, when you need it.  We recommend signing up for the patient portal called "MyChart".  Sign up information is provided on this After Visit Summary.  MyChart is used to connect with patients for Virtual Visits (Telemedicine).  Patients are able to view lab/test results, encounter notes, upcoming appointments, etc.  Non-urgent messages can be sent to your provider as well.   To learn more about what you can do with MyChart, go to [email or web address].    Your next appointment:   12 month(s)  The format for your next appointment:   In Person  Provider:   Jenne Campus, MD    Other Instructions NA

## 2022-08-25 NOTE — Progress Notes (Unsigned)
Cardiology Office Note:    Date:  08/25/2022   ID:  [employer]ry [employer], DOB 1975/10/12, MRN [id number]  PCP:  Libby Maw, MD  Cardiologist:  Jenne Campus, MD    Referring MD: Libby Maw,*   Chief Complaint  Patient presents with   Follow-up    History of Present Illness:    Larry Parks is a 47 y.o. male    status post aortic valve replacement with On-X 23 valve, aortic aneurysm repair, dyslipidemia, hypertriglyceridemia  Comes today to my office for follow-up.  Overall doing very well.  He denies have any chest pain tightness squeezing pressure burning chest, he tried to exercise on the regular basis he tried to exercise 3-4 times a week have no difficulty doing it he knows that he should not do isovolumetric exercises he should not do weightlifting  Past Medical History:  Diagnosis Date   Allergy    Angioedema of lips 10/22/2019   Anxiety    Depression    Dissecting aortic aneurysm (any part), thoracic ([place])    2019   Dyslipidemia 03/12/2019   Hepatic steatosis 11/03/2017   Formatting of this note might be different from the original. Noted on CT chest obtained 11/03/2017   Hyperlipidemia    Hypertension    Hypertriglyceridemia 04/23/2020   Other allergic rhinitis 11/14/2019   Pruritic rash 11/14/2019   Sleep apnea    uses a CPAP   Status post aortic valve replacement 03/12/2019   Status post ascending aortic aneurysm repair 12/03/2017   Formatting of this note might be different from the original. 11/29/2017 Aortic root replacement with #23 OnX mechanical composite prosthesis with coronary reconstruction and graft replacement of ascending aorta with #26 Hemashield (Dr.Rowe).   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/2019    Past Surgical History:  Procedure Laterality Date   aortic valve replaced  11/2017   aortic aneurism    Current Medications: Current Meds  Medication Sig   Cholecalciferol (VITAMIN D3) 125 MCG (5000 UT) CAPS Take 1  capsule by mouth daily.   co-enzyme Q-10 30 MG capsule Take 30 mg by mouth 3 (three) times daily.   diclofenac Sodium (VOLTAREN) 1 % GEL Apply a small grape sized dollop to sore place on foot 3 times daily as needed. (Patient taking differently: Apply 2 g topically 3 (three) times daily as needed (pain). Apply a small grape sized dollop to sore place on foot 3 times daily as needed.)   ezetimibe (ZETIA) 10 MG tablet TAKE 1 TABLET (10 MG TOTAL) BY MOUTH DAILY.   Omega-3 Fatty Acids (FISH OIL PO) Take 1 tablet by mouth daily. 2 capsules twice a day   rosuvastatin (CRESTOR) 10 MG tablet TAKE 1 TABLET (10 MG TOTAL) BY MOUTH DAILY.   tamsulosin (FLOMAX) 0.4 MG CAPS capsule Take 1 capsule (0.4 mg total) by mouth daily.   warfarin (COUMADIN) 5 MG tablet Take 1 tablet daily except 1.5 tablets each Sunday, Wednesday and Saturday or as directed by Anticoagulation Clinic. (Patient taking differently: Take 5 mg by mouth See admin instructions. Take 1 tablet daily except 1.5 tablets each Sunday, Wednesday and Saturday or as directed by Anticoagulation Clinic.)     Allergies:   Bee pollen and Pollen extract   Social History   Socioeconomic History   Marital status: Married    Spouse name: Not on file   Number of children: 2   Years of education: Not on file   Highest education level: Not on file  Occupational History   Occupation: Assoc. prof. @ [employer]  Tobacco Use   Smoking status: Former    Types: Cigarettes    Quit date: 2004    Years since quitting: 20.0   Smokeless tobacco: Never  Vaping Use   Vaping Use: Never used  Substance and Sexual Activity   Alcohol use: Not Currently    Comment: socially - last 2008   Drug use: Never   Sexual activity: Yes    Birth control/protection: Condom  Other Topics Concern   Not on file  Social History Narrative   Caffeine: 2 cups/day   Social Determinants of Health   Financial Resource Strain: Not on file  Food Insecurity: Not on file   Transportation Needs: Not on file  Physical Activity: Not on file  Stress: Not on file  Social Connections: Not on file     Family History: The patient's family history includes Diabetes in his father; Heart attack in his father. There is no history of Colon cancer, Esophageal cancer, Rectal cancer, Stomach cancer, or Prostate cancer. ROS:   Please see the history of present illness.    All 14 point review of systems negative except as described per history of present illness  EKGs/Labs/Other Studies Reviewed:      Recent Labs: 02/25/2022: ALT 21; BUN 12; Creatinine, Ser 0.85; Hemoglobin 13.4; Platelets 172.0; Potassium 3.8; Sodium 138  Recent Lipid Panel    Component Value Date/Time   CHOL 91 02/25/2022 1204   CHOL 105 06/24/2021 1039   TRIG 103.0 02/25/2022 1204   HDL 35.40 (L) 02/25/2022 1204   HDL 39 (L) 06/24/2021 1039   CHOLHDL 3 02/25/2022 1204   VLDL 20.6 02/25/2022 1204   LDLCALC 35 02/25/2022 1204   LDLCALC 47 06/24/2021 1039    Physical Exam:    VS:  BP 120/82 (BP Location: Left Arm, Patient Position: Sitting)   Pulse 86   Ht '5\' 6"'$  (1.676 m)   Wt 181 lb (82.1 kg)   SpO2 98%   BMI 29.21 kg/m     Wt Readings from Last 3 Encounters:  08/25/22 181 lb (82.1 kg)  04/02/22 179 lb 6.4 oz (81.4 kg)  02/25/22 177 lb (80.3 kg)     GEN:  Well nourished, well developed in no acute distress HEENT: Normal NECK: No JVD; No carotid bruits LYMPHATICS: No lymphadenopathy CARDIAC: RRR, no murmurs, no rubs, no gallops, crisp mechanical valve sounds RESPIRATORY:  Clear to auscultation without rales, wheezing or rhonchi  ABDOMEN: Soft, non-tender, non-distended MUSCULOSKELETAL:  No edema; No deformity  SKIN: Warm and dry LOWER EXTREMITIES: no swelling NEUROLOGIC:  Alert and oriented x 3 PSYCHIATRIC:  Normal affect   ASSESSMENT:    1. Status post ascending aortic aneurysm repair   2. Status post aortic valve replacement   3. Presence of prosthetic heart valve   4.  Mixed hyperlipidemia    PLAN:    In order of problems listed above:  Status post stenting arteries repair last time checked a repair was in November 2022 we will schedule him to have another CT of the chest. Status post aortic valve replacement last echocardiogram reviewed normal function normal ejection fraction continue present management. Mixed dyslipidemia to be followed by antimedicine team.  He is on Crestor 10 which I will continue, last cholesterol I see is from 6 months ago with HDL of 35.4, LDL 35.  Will continue present management. Anticoagulation: Followed by INR clinic stable   Medication Adjustments/Labs and Tests  Ordered: Current medicines are reviewed at length with the patient today.  Concerns regarding medicines are outlined above.  No orders of the defined types were placed in this encounter.  Medication changes: No orders of the defined types were placed in this encounter.   Signed, Park Liter, MD, [facility] 08/25/2022 4:41 PM    [place]

## 2022-08-30 ENCOUNTER — Ambulatory Visit: Payer: BC Managed Care – PPO | Attending: Cardiology

## 2022-08-30 DIAGNOSIS — Z7901 Long term (current) use of anticoagulants: Secondary | ICD-10-CM | POA: Diagnosis not present

## 2022-08-30 DIAGNOSIS — Z5181 Encounter for therapeutic drug level monitoring: Secondary | ICD-10-CM | POA: Diagnosis not present

## 2022-08-30 DIAGNOSIS — Z952 Presence of prosthetic heart valve: Secondary | ICD-10-CM | POA: Diagnosis not present

## 2022-08-30 LAB — POCT INR: INR: 2.4 (ref 2.0–3.0)

## 2022-08-30 NOTE — Patient Instructions (Signed)
TAKE 0.5 TABLET TODAY ONLY THEN Continue taking 1 tablet daily except 1.5 tablets each Sunday, Wednesday and Saturday Next INR in 7 weeks. Call clinic at [phone] for changes in medication or procedures

## 2022-08-31 ENCOUNTER — Encounter: Payer: Self-pay | Admitting: Cardiology

## 2022-09-03 ENCOUNTER — Telehealth: Payer: Self-pay | Admitting: Cardiology

## 2022-09-03 NOTE — Telephone Encounter (Signed)
Larry Parks with [facility] called in stating she received order for a ct chest but with the pt's diagnosis, she needs an order for CTA chest W/ contrast.   Fax : [phone]

## 2022-09-07 NOTE — Telephone Encounter (Signed)
Order faxed to [facility] for CTA Chest W/Contrast as requested by [facility]

## 2022-09-14 LAB — BASIC METABOLIC PANEL
BUN/Creatinine Ratio: 15 (ref 9–20)
BUN: 13 mg/dL (ref 6–24)
CO2: 21 mmol/L (ref 20–29)
Calcium: 9.5 mg/dL (ref 8.7–10.2)
Chloride: 106 mmol/L (ref 96–106)
Creatinine, Ser: 0.88 mg/dL (ref 0.76–1.27)
Glucose: 93 mg/dL (ref 70–99)
Potassium: 3.9 mmol/L (ref 3.5–5.2)
Sodium: 145 mmol/L — ABNORMAL HIGH (ref 134–144)
eGFR: 105 mL/min/{1.73_m2} (ref >59)

## 2022-09-17 ENCOUNTER — Encounter: Payer: Self-pay | Admitting: Cardiology

## 2022-09-17 ENCOUNTER — Telehealth: Payer: Self-pay

## 2022-09-17 NOTE — Telephone Encounter (Signed)
-----  Message from Park Liter, MD sent at 09/17/2022  8:52 AM EST ----- Chem-7 looks good, continue with management as planned, CT of the chest

## 2022-09-17 NOTE — Telephone Encounter (Signed)
Patient notified of results. CT results sent to Dr. Raliegh Ip.

## 2022-09-27 ENCOUNTER — Telehealth: Payer: Self-pay

## 2022-09-27 NOTE — Telephone Encounter (Signed)
Results reviewed with pt as per Dr. Krasowski's note.  Pt verbalized understanding and had no additional questions. Routed to PCP  

## 2022-09-28 ENCOUNTER — Encounter: Payer: Self-pay | Admitting: Family Medicine

## 2022-09-28 ENCOUNTER — Ambulatory Visit: Payer: BC Managed Care – PPO | Admitting: Family Medicine

## 2022-09-28 VITALS — BP 130/68 | HR 76 | Temp 97.5°F | Ht 66.0 in | Wt 183.0 lb

## 2022-09-28 DIAGNOSIS — R1030 Lower abdominal pain, unspecified: Secondary | ICD-10-CM

## 2022-09-28 NOTE — Progress Notes (Unsigned)
Established Patient Office Visit   Subjective:  Patient ID: [employer], male    DOB: 1976-07-15  Age: 47 y.o. MRN: [id number]  Chief Complaint  Patient presents with   Abdominal Pain    Abdominal cramping x 4 days patient would like hernia checked.     Abdominal Pain Pertinent negatives include no constipation, diarrhea, dysuria, frequency, hematuria, melena or myalgias.   Encounter Diagnoses  Name Primary?   Lower abdominal pain Yes   Experienced acute mid lower abdominal pain described as crampy 2 days ago.  Pain came and then totally resolved.  There was soreness in his abdominal wall that seem to linger but that has resolved as well.  Denies any changes in his bowel or bladder function.  There has been no blood in his stool constipation diarrhea or melena.  Denies any problems with urination.  Denies blood in his urine.  Recent colonoscopy 2021 and was cleared for 7 years. {History (Optional):23778}  Review of Systems  Constitutional: Negative.   HENT: Negative.    Eyes:  Negative for blurred vision, discharge and redness.  Respiratory: Negative.    Cardiovascular: Negative.   Gastrointestinal:  Positive for abdominal pain. Negative for blood in stool, constipation, diarrhea and melena.  Genitourinary: Negative.  Negative for dysuria, frequency and hematuria.  Musculoskeletal: Negative.  Negative for myalgias.  Skin:  Negative for rash.  Neurological:  Negative for tingling, loss of consciousness and weakness.  Endo/Heme/Allergies:  Negative for polydipsia.     Current Outpatient Medications:    Cholecalciferol (VITAMIN D3) 125 MCG (5000 UT) CAPS, Take 1 capsule by mouth daily., Disp: , Rfl:    co-enzyme Q-10 30 MG capsule, Take 30 mg by mouth 3 (three) times daily., Disp: , Rfl:    diclofenac Sodium (VOLTAREN) 1 % GEL, Apply a small grape sized dollop to sore place on foot 3 times daily as needed. (Patient taking differently: Apply 2 g topically 3 (three) times daily  as needed (pain). Apply a small grape sized dollop to sore place on foot 3 times daily as needed.), Disp: 150 g, Rfl: 2   ezetimibe (ZETIA) 10 MG tablet, TAKE 1 TABLET (10 MG TOTAL) BY MOUTH DAILY., Disp: 90 tablet, Rfl: 3   Omega-3 Fatty Acids (FISH OIL PO), Take 1 tablet by mouth daily. 2 capsules twice a day, Disp: , Rfl:    rosuvastatin (CRESTOR) 10 MG tablet, TAKE 1 TABLET (10 MG TOTAL) BY MOUTH DAILY., Disp: 90 tablet, Rfl: 3   tamsulosin (FLOMAX) 0.4 MG CAPS capsule, Take 1 capsule (0.4 mg total) by mouth daily., Disp: 90 capsule, Rfl: 3   warfarin (COUMADIN) 5 MG tablet, Take 1 tablet daily except 1.5 tablets each Sunday, Wednesday and Saturday or as directed by Anticoagulation Clinic. (Patient taking differently: Take 5 mg by mouth See admin instructions. Take 1 tablet daily except 1.5 tablets each Sunday, Wednesday and Saturday or as directed by Anticoagulation Clinic.), Disp: 140 tablet, Rfl: 1   Objective:     BP 130/68 (BP Location: Right Arm, Patient Position: Sitting, Cuff Size: Normal)   Pulse 76   Temp (!) 97.5 F (36.4 C) (Temporal)   Ht '5\' 6"'$  (1.676 m)   Wt 183 lb (83 kg)   SpO2 97%   BMI 29.54 kg/m  {Vitals History (Optional):23777}  Physical Exam Constitutional:      General: He is not in acute distress.    Appearance: Normal appearance. He is not ill-appearing, toxic-appearing or diaphoretic.  HENT:  Head: Normocephalic and atraumatic.     Right Ear: External ear normal.     Left Ear: External ear normal.     Mouth/Throat:     Mouth: Mucous membranes are moist.     Pharynx: Oropharynx is clear. No oropharyngeal exudate or posterior oropharyngeal erythema.  Eyes:     General: No scleral icterus.       Right eye: No discharge.        Left eye: No discharge.     Extraocular Movements: Extraocular movements intact.     Conjunctiva/sclera: Conjunctivae normal.     Pupils: Pupils are equal, round, and reactive to light.  Cardiovascular:     Rate and Rhythm:  Normal rate and regular rhythm.  Pulmonary:     Effort: Pulmonary effort is normal. No respiratory distress.     Breath sounds: Normal breath sounds.  Abdominal:     General: Bowel sounds are normal. There is no distension. There are no signs of injury.     Palpations: Abdomen is soft.     Tenderness: There is no abdominal tenderness. There is no right CVA tenderness, left CVA tenderness, guarding or rebound.     Hernia: A hernia is present. Hernia is present in the umbilical area.     Comments: Mild tenderness to palpation of umbilical hernia  Genitourinary:    Penis: Normal.      Testes:        Right: Mass, tenderness or swelling not present.        Left: Mass, tenderness or swelling not present.  Musculoskeletal:     Cervical back: No rigidity or tenderness.  Skin:    General: Skin is warm and dry.  Neurological:     Mental Status: He is alert and oriented to person, place, and time.  Psychiatric:        Mood and Affect: Mood normal.        Behavior: Behavior normal.      No results found for any visits on 09/28/22.  {Labs (Optional):23779}  The ASCVD Risk score (Arnett DK, et al., 2019) failed to calculate for the following reasons:   The valid total cholesterol range is 130 to 320 mg/dL    Assessment & Plan:   Lower abdominal pain -     Amylase -     CBC with Differential/Platelet -     Comprehensive metabolic panel -     Urinalysis, Routine w reflex microscopic    Return come back to the clinic if symptoms return.Libby Maw, MD

## 2022-09-29 LAB — COMPREHENSIVE METABOLIC PANEL
ALT: 20 U/L (ref 0–53)
AST: 20 U/L (ref 0–37)
Albumin: 4.7 g/dL (ref 3.5–5.2)
Alkaline Phosphatase: 48 U/L (ref 39–117)
BUN: 17 mg/dL (ref 6–23)
CO2: 27 mEq/L (ref 19–32)
Calcium: 9.1 mg/dL (ref 8.4–10.5)
Chloride: 105 mEq/L (ref 96–112)
Creatinine, Ser: 0.89 mg/dL (ref 0.40–1.50)
GFR: 100.93 mL/min (ref >60.00)
Glucose, Bld: 113 mg/dL — ABNORMAL HIGH (ref 70–99)
Potassium: 3.7 mEq/L (ref 3.5–5.1)
Sodium: 141 mEq/L (ref 135–145)
Total Bilirubin: 0.5 mg/dL (ref 0.2–1.2)
Total Protein: 7.3 g/dL (ref 6.0–8.3)

## 2022-09-29 LAB — URINALYSIS, ROUTINE W REFLEX MICROSCOPIC
Bilirubin Urine: NEGATIVE
Hgb urine dipstick: NEGATIVE
Ketones, ur: NEGATIVE
Leukocytes,Ua: NEGATIVE
Nitrite: NEGATIVE
RBC / HPF: NONE SEEN (ref 0–?)
Specific Gravity, Urine: 1.025 (ref 1.000–1.030)
Total Protein, Urine: NEGATIVE
Urine Glucose: NEGATIVE
Urobilinogen, UA: 1 (ref 0.0–1.0)
pH: 7 (ref 5.0–8.0)

## 2022-09-29 LAB — CBC WITH DIFFERENTIAL/PLATELET
Basophils Absolute: 0.1 10*3/uL (ref 0.0–0.1)
Basophils Relative: 1.6 % (ref 0.0–3.0)
Eosinophils Absolute: 0.1 10*3/uL (ref 0.0–0.7)
Eosinophils Relative: 2.1 % (ref 0.0–5.0)
HCT: 40.5 % (ref 39.0–52.0)
Hemoglobin: 13.7 g/dL (ref 13.0–17.0)
Lymphocytes Relative: 35.7 % (ref 12.0–46.0)
Lymphs Abs: 2.1 10*3/uL (ref 0.7–4.0)
MCHC: 33.9 g/dL (ref 30.0–36.0)
MCV: 83.8 fl (ref 78.0–100.0)
Monocytes Absolute: 0.5 10*3/uL (ref 0.1–1.0)
Monocytes Relative: 7.9 % (ref 3.0–12.0)
Neutro Abs: 3.1 10*3/uL (ref 1.4–7.7)
Neutrophils Relative %: 52.7 % (ref 43.0–77.0)
Platelets: 179 10*3/uL (ref 150.0–400.0)
RBC: 4.84 Mil/uL (ref 4.22–5.81)
RDW: 13 % (ref 11.5–15.5)
WBC: 5.8 10*3/uL (ref 4.0–10.5)

## 2022-09-29 LAB — AMYLASE: Amylase: 63 U/L (ref 27–131)

## 2022-10-18 ENCOUNTER — Ambulatory Visit: Payer: BC Managed Care – PPO | Attending: Cardiology | Admitting: *Deleted

## 2022-10-18 DIAGNOSIS — Z7901 Long term (current) use of anticoagulants: Secondary | ICD-10-CM

## 2022-10-18 DIAGNOSIS — Z5181 Encounter for therapeutic drug level monitoring: Secondary | ICD-10-CM | POA: Diagnosis not present

## 2022-10-18 DIAGNOSIS — Z952 Presence of prosthetic heart valve: Secondary | ICD-10-CM

## 2022-10-18 LAB — POCT INR: POC INR: 1.7

## 2022-10-18 NOTE — Patient Instructions (Signed)
Description   Continue taking 1 tablet daily except 1.5 tablets each Sunday, Wednesday and Saturday Next INR in 7 weeks. Call clinic at [phone] for changes in medication or procedures

## 2022-12-06 ENCOUNTER — Ambulatory Visit: Payer: BC Managed Care – PPO | Attending: Cardiology | Admitting: Pharmacist

## 2022-12-06 DIAGNOSIS — Z7901 Long term (current) use of anticoagulants: Secondary | ICD-10-CM | POA: Diagnosis not present

## 2022-12-06 DIAGNOSIS — Z952 Presence of prosthetic heart valve: Secondary | ICD-10-CM

## 2022-12-06 LAB — POCT INR: POC INR: 2.8

## 2022-12-06 NOTE — Patient Instructions (Addendum)
Hold dose today then continue taking 1 tablet daily except 1.5 tablets each Sunday, Wednesday and Saturday Next INR in 5 weeks. Call clinic at [phone] for changes in medication or procedures

## 2023-01-10 ENCOUNTER — Encounter: Payer: Self-pay | Admitting: Family Medicine

## 2023-01-13 ENCOUNTER — Ambulatory Visit: Payer: BC Managed Care – PPO | Attending: Cardiology | Admitting: *Deleted

## 2023-01-13 DIAGNOSIS — Z952 Presence of prosthetic heart valve: Secondary | ICD-10-CM | POA: Diagnosis not present

## 2023-01-13 DIAGNOSIS — Z7901 Long term (current) use of anticoagulants: Secondary | ICD-10-CM

## 2023-01-13 LAB — POCT INR: INR: 2.2 (ref 2.0–3.0)

## 2023-01-13 NOTE — Patient Instructions (Signed)
Description   Today take 1/2 tablet of warfarin then continue taking 1 tablet daily except 1.5 tablets each Sunday, Wednesday and Saturday Next INR in 5 weeks. Call clinic at [phone] for changes in medication or procedures

## 2023-01-18 ENCOUNTER — Other Ambulatory Visit: Payer: Self-pay | Admitting: Cardiology

## 2023-01-18 DIAGNOSIS — Z7901 Long term (current) use of anticoagulants: Secondary | ICD-10-CM

## 2023-01-18 DIAGNOSIS — Z952 Presence of prosthetic heart valve: Secondary | ICD-10-CM

## 2023-01-19 NOTE — Telephone Encounter (Signed)
Prescription refill request received for warfarin Lov: 08/25/22 Bing Matter)  Next INR check: 02/18/23 Warfarin tablet strength: 5mg   Appropriate dose. Refill sent.

## 2023-02-18 ENCOUNTER — Ambulatory Visit: Payer: BC Managed Care – PPO | Attending: Cardiovascular Disease

## 2023-02-18 DIAGNOSIS — Z7901 Long term (current) use of anticoagulants: Secondary | ICD-10-CM | POA: Diagnosis not present

## 2023-02-18 DIAGNOSIS — Z952 Presence of prosthetic heart valve: Secondary | ICD-10-CM

## 2023-02-18 LAB — POCT INR: INR: 2.8 (ref 2.0–3.0)

## 2023-02-18 NOTE — Patient Instructions (Signed)
HOLD TODAY ONLY  then continue taking 1 tablet daily except 1.5 tablets each Sunday, Wednesday and Saturday Next INR in 4 weeks. Call clinic at [phone] for changes in medication or procedures

## 2023-03-17 ENCOUNTER — Other Ambulatory Visit: Payer: Self-pay | Admitting: Family Medicine

## 2023-03-17 DIAGNOSIS — N401 Enlarged prostate with lower urinary tract symptoms: Secondary | ICD-10-CM

## 2023-03-18 ENCOUNTER — Ambulatory Visit: Payer: BC Managed Care – PPO

## 2023-03-22 ENCOUNTER — Ambulatory Visit: Payer: BC Managed Care – PPO | Attending: Cardiology

## 2023-03-22 DIAGNOSIS — Z952 Presence of prosthetic heart valve: Secondary | ICD-10-CM | POA: Diagnosis not present

## 2023-03-22 DIAGNOSIS — Z7901 Long term (current) use of anticoagulants: Secondary | ICD-10-CM

## 2023-03-22 LAB — POCT INR: INR: 2.6 (ref 2.0–3.0)

## 2023-03-22 NOTE — Patient Instructions (Signed)
HOLD TODAY ONLY  then DECREASE TO 1 tablet daily except 1.5 tablets each Wednesday and Saturday Next INR in 4 weeks. Call clinic at [phone] for changes in medication or procedures

## 2023-04-19 ENCOUNTER — Ambulatory Visit: Payer: BC Managed Care – PPO | Attending: Cardiology

## 2023-04-19 DIAGNOSIS — Z7901 Long term (current) use of anticoagulants: Secondary | ICD-10-CM

## 2023-04-19 DIAGNOSIS — Z952 Presence of prosthetic heart valve: Secondary | ICD-10-CM | POA: Diagnosis not present

## 2023-04-19 LAB — POCT INR: INR: 3.2 — AB (ref 2.0–3.0)

## 2023-04-19 NOTE — Patient Instructions (Signed)
HOLD TODAY ONLY then DECREASE TO 1 tablet daily. Next INR in 3 weeks. Call clinic at [phone] for changes in medication or procedures

## 2023-05-06 ENCOUNTER — Other Ambulatory Visit: Payer: Self-pay | Admitting: Family Medicine

## 2023-05-06 DIAGNOSIS — E785 Hyperlipidemia, unspecified: Secondary | ICD-10-CM

## 2023-05-10 ENCOUNTER — Other Ambulatory Visit: Payer: Self-pay | Admitting: Family Medicine

## 2023-05-10 DIAGNOSIS — E785 Hyperlipidemia, unspecified: Secondary | ICD-10-CM

## 2023-05-19 ENCOUNTER — Ambulatory Visit: Payer: BC Managed Care – PPO | Attending: Cardiology

## 2023-05-19 DIAGNOSIS — Z7901 Long term (current) use of anticoagulants: Secondary | ICD-10-CM | POA: Diagnosis not present

## 2023-05-19 DIAGNOSIS — Z952 Presence of prosthetic heart valve: Secondary | ICD-10-CM | POA: Diagnosis not present

## 2023-05-19 LAB — POCT INR: INR: 1.7 — AB (ref 2.0–3.0)

## 2023-05-19 NOTE — Patient Instructions (Signed)
Description   Continue on same dosage of Warfarin 1 tablet daily. Next INR in 4 weeks. Call clinic at [phone] for changes in medication or procedures

## 2023-05-31 ENCOUNTER — Telehealth: Payer: BC Managed Care – PPO | Admitting: Adult Health

## 2023-06-03 ENCOUNTER — Telehealth: Payer: BC Managed Care – PPO | Admitting: Adult Health

## 2023-06-10 ENCOUNTER — Other Ambulatory Visit: Payer: Self-pay | Admitting: Family Medicine

## 2023-06-10 DIAGNOSIS — E785 Hyperlipidemia, unspecified: Secondary | ICD-10-CM

## 2023-06-23 ENCOUNTER — Ambulatory Visit: Payer: BC Managed Care – PPO | Attending: Cardiology

## 2023-06-23 DIAGNOSIS — Z952 Presence of prosthetic heart valve: Secondary | ICD-10-CM | POA: Diagnosis not present

## 2023-06-23 DIAGNOSIS — Z7901 Long term (current) use of anticoagulants: Secondary | ICD-10-CM | POA: Diagnosis not present

## 2023-06-23 LAB — POCT INR: INR: 1.5 — AB (ref 2.0–3.0)

## 2023-06-23 NOTE — Patient Instructions (Signed)
Description   Take 1.5 tablets today and then continue on same dosage of Warfarin 1 tablet daily. Next INR in 5 weeks.  Call clinic at [phone] for changes in medication or procedures

## 2023-07-07 ENCOUNTER — Ambulatory Visit: Payer: BC Managed Care – PPO | Admitting: Family Medicine

## 2023-07-07 VITALS — BP 110/60 | HR 75 | Temp 97.7°F | Ht 66.0 in | Wt 187.8 lb

## 2023-07-07 DIAGNOSIS — R351 Nocturia: Secondary | ICD-10-CM

## 2023-07-07 DIAGNOSIS — G4709 Other insomnia: Secondary | ICD-10-CM

## 2023-07-07 DIAGNOSIS — Z0000 Encounter for general adult medical examination without abnormal findings: Secondary | ICD-10-CM | POA: Diagnosis not present

## 2023-07-07 DIAGNOSIS — Z23 Encounter for immunization: Secondary | ICD-10-CM | POA: Diagnosis not present

## 2023-07-07 DIAGNOSIS — E785 Hyperlipidemia, unspecified: Secondary | ICD-10-CM

## 2023-07-07 DIAGNOSIS — N401 Enlarged prostate with lower urinary tract symptoms: Secondary | ICD-10-CM | POA: Diagnosis not present

## 2023-07-07 DIAGNOSIS — Z131 Encounter for screening for diabetes mellitus: Secondary | ICD-10-CM

## 2023-07-07 DIAGNOSIS — Z5320 Procedure and treatment not carried out because of patient's decision for unspecified reasons: Secondary | ICD-10-CM

## 2023-07-07 DIAGNOSIS — E538 Deficiency of other specified B group vitamins: Secondary | ICD-10-CM | POA: Insufficient documentation

## 2023-07-07 DIAGNOSIS — I10 Essential (primary) hypertension: Secondary | ICD-10-CM

## 2023-07-07 HISTORY — DX: Encounter for screening for diabetes mellitus: Z13.1

## 2023-07-07 NOTE — Progress Notes (Signed)
Established Patient Office Visit   Subjective:  Patient ID: Larry Parks, male    DOB: Jul 11, 1976  Age: 47 y.o. MRN: [id number]  Chief Complaint  Patient presents with   Annual Exam    Not fasting today.    HPI Encounter Diagnoses  Name Primary?   Healthcare maintenance Yes   Dyslipidemia    Benign prostatic hyperplasia with nocturia    Flu vaccine need    Screening for diabetes mellitus    Screening for hepatitis C declined    B12 deficiency    Essential hypertension    Other insomnia    For physical and follow-up of above.  Continues with tamsulosin with good effect continues to help his urine flow.  Libido is good and so far there have been no issues with erectile dysfunction.  However, he has noticed that he no longer awakens with an erection each morning.  Continues low-dose rosuvastatin for elevated cholesterol without issue.  He is nonfasting this afternoon.  He says that sometimes he drops things all unintentionally.  He denies any pain weakness numbness or tingling in his hands.  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  Positive family history of diabetes.  He does have regular dental care.  He is exercising by walking and jogging.  He lives with his wife this has been a stable relationship.   Review of Systems  Constitutional: Negative.   HENT: Negative.    Eyes:  Negative for blurred vision, discharge and redness.  Respiratory: Negative.    Cardiovascular: Negative.   Gastrointestinal:  Negative for abdominal pain.  Genitourinary: Negative.   Musculoskeletal: Negative.  Negative for myalgias.  Skin:  Negative for rash.  Neurological:  Negative for tingling, loss of consciousness and weakness.  Endo/Heme/Allergies:  Negative for polydipsia.      07/07/2023    1:18 PM 07/07/2023    1:13 PM 09/28/2022    3:57 PM  Depression screen PHQ 2/9  Decreased Interest 0 0 0  Down, Depressed, Hopeless 0 0 0  PHQ - 2 Score 0 0 0  Altered  sleeping 1    Tired, decreased energy 0    Change in appetite 0    Feeling bad or failure about yourself  0    Trouble concentrating 0    Moving slowly or fidgety/restless 0    Suicidal thoughts 0    PHQ-9 Score 1    Difficult doing work/chores Not difficult at all         Current Outpatient Medications:    Cholecalciferol (VITAMIN D3) 125 MCG (5000 UT) CAPS, Take 1 capsule by mouth daily., Disp: , Rfl:    co-enzyme Q-10 30 MG capsule, Take 30 mg by mouth 3 (three) times daily., Disp: , Rfl:    ezetimibe (ZETIA) 10 MG tablet, TAKE 1 TABLET (10 MG TOTAL) BY MOUTH DAILY., Disp: 90 tablet, Rfl: 3   Omega-3 Fatty Acids (FISH OIL PO), Take 1 tablet by mouth daily. 2 capsules twice a day, Disp: , Rfl:    rosuvastatin (CRESTOR) 10 MG tablet, TAKE 1 TABLET (10 MG TOTAL) BY MOUTH DAILY., Disp: 30 tablet, Rfl: 0   tamsulosin (FLOMAX) 0.4 MG CAPS capsule, TAKE 1 CAPSULE (0.4 MG TOTAL) BY MOUTH DAILY., Disp: 90 capsule, Rfl: 3   warfarin (COUMADIN) 5 MG tablet, TAKE 1 TABLET DAILY EXCEPT 1.5 TABLETS EACH SUNDAY, WEDNESDAY AND SATURDAY OR AS DIRECTED BY ANTICOAGULATION CLINIC., Disp: 140 tablet, Rfl: 1  cyanocobalamin 100 MCG tablet, Take 100 mcg by mouth daily., Disp: , Rfl:    diclofenac Sodium (VOLTAREN) 1 % GEL, Apply a small grape sized dollop to sore place on foot 3 times daily as needed. (Patient not taking: Reported on 07/07/2023), Disp: 150 g, Rfl: 2   lisinopril (ZESTRIL) 5 MG tablet, Take 5 mg by mouth daily., Disp: , Rfl:    melatonin 3 MG TABS tablet, Take 1 mg by mouth at bedtime., Disp: , Rfl:    Objective:     BP 110/60 (BP Location: Right Arm, Patient Position: Sitting)   Pulse 75   Temp 97.7 F (36.5 C) (Temporal)   Ht 5\' 6"  (1.676 m)   Wt 187 lb 12.8 oz (85.2 kg)   SpO2 95%   BMI 30.31 kg/m    Physical Exam Constitutional:      General: He is not in acute distress.    Appearance: Normal appearance. He is not ill-appearing, toxic-appearing or diaphoretic.  HENT:      Head: Normocephalic and atraumatic.     Right Ear: Tympanic membrane, ear canal and external ear normal.     Left Ear: Tympanic membrane, ear canal and external ear normal.     Mouth/Throat:     Mouth: Mucous membranes are moist.     Pharynx: Oropharynx is clear. No oropharyngeal exudate or posterior oropharyngeal erythema.  Eyes:     General: No scleral icterus.       Right eye: No discharge.        Left eye: No discharge.     Extraocular Movements: Extraocular movements intact.     Conjunctiva/sclera: Conjunctivae normal.     Pupils: Pupils are equal, round, and reactive to light.  Cardiovascular:     Rate and Rhythm: Normal rate and regular rhythm.  Pulmonary:     Effort: Pulmonary effort is normal. No respiratory distress.     Breath sounds: Normal breath sounds.  Abdominal:     General: Bowel sounds are normal.     Tenderness: There is no abdominal tenderness. There is no guarding or rebound.     Hernia: A hernia (umbilical) is present.  Musculoskeletal:     Right hand: Normal. No bony tenderness. Normal range of motion. Normal strength.     Left hand: Normal. No bony tenderness. Normal range of motion. Normal strength.     Cervical back: No rigidity or tenderness.  Skin:    General: Skin is warm and dry.  Neurological:     Mental Status: He is alert and oriented to person, place, and time.  Psychiatric:        Mood and Affect: Mood normal.        Behavior: Behavior normal.   Remaining yeah possibly will know what they are may be needing they want to talk about it sure  Right regular patient is a regular patient   No results found for any visits on 07/07/23.    The ASCVD Risk score (Arnett DK, et al., 2019) failed to calculate for the following reasons:   The valid total cholesterol range is 130 to 320 mg/dL    Assessment & Plan:   Healthcare maintenance -     CBC; Future  Dyslipidemia -     Comprehensive metabolic panel; Future -     Lipid panel;  Future  Benign prostatic hyperplasia with nocturia -     PSA; Future  Flu vaccine need -     Flu vaccine trivalent PF, 6mos  and older(Flulaval,Afluria,Fluarix,Fluzone)  Screening for diabetes mellitus -     Urinalysis, Routine w reflex microscopic; Future -     Hemoglobin A1c; Future  Screening for hepatitis C declined  B12 deficiency -     Vitamin B12; Future  Essential hypertension  Other insomnia    Return in about 6 months (around 01/04/2024).  Will return if symptoms with pends worsen.  Exam is normal today.  Continue all medications as above.  Information was given on preventing diabetes and prediabetes..  Advised weight loss and exercise.  Information was given on exercising to lose weight.  Information given on health maintenance  Mliss Sax, MD

## 2023-07-11 ENCOUNTER — Other Ambulatory Visit: Payer: BC Managed Care – PPO

## 2023-07-12 ENCOUNTER — Other Ambulatory Visit (INDEPENDENT_AMBULATORY_CARE_PROVIDER_SITE_OTHER): Payer: BC Managed Care – PPO

## 2023-07-12 DIAGNOSIS — N401 Enlarged prostate with lower urinary tract symptoms: Secondary | ICD-10-CM

## 2023-07-12 DIAGNOSIS — R351 Nocturia: Secondary | ICD-10-CM | POA: Diagnosis not present

## 2023-07-12 DIAGNOSIS — Z0000 Encounter for general adult medical examination without abnormal findings: Secondary | ICD-10-CM

## 2023-07-12 DIAGNOSIS — E538 Deficiency of other specified B group vitamins: Secondary | ICD-10-CM

## 2023-07-12 DIAGNOSIS — E785 Hyperlipidemia, unspecified: Secondary | ICD-10-CM | POA: Diagnosis not present

## 2023-07-12 DIAGNOSIS — Z131 Encounter for screening for diabetes mellitus: Secondary | ICD-10-CM | POA: Diagnosis not present

## 2023-07-12 LAB — URINALYSIS, ROUTINE W REFLEX MICROSCOPIC
Bilirubin Urine: NEGATIVE
Hgb urine dipstick: NEGATIVE
Ketones, ur: NEGATIVE
Leukocytes,Ua: NEGATIVE
Nitrite: NEGATIVE
RBC / HPF: NONE SEEN (ref 0–?)
Specific Gravity, Urine: 1.02 (ref 1.000–1.030)
Total Protein, Urine: NEGATIVE
Urine Glucose: NEGATIVE
Urobilinogen, UA: 0.2 (ref 0.0–1.0)
pH: 6.5 (ref 5.0–8.0)

## 2023-07-12 LAB — COMPREHENSIVE METABOLIC PANEL
ALT: 23 U/L (ref 0–53)
AST: 20 U/L (ref 0–37)
Albumin: 4.4 g/dL (ref 3.5–5.2)
Alkaline Phosphatase: 53 U/L (ref 39–117)
BUN: 15 mg/dL (ref 6–23)
CO2: 30 meq/L (ref 19–32)
Calcium: 9.4 mg/dL (ref 8.4–10.5)
Chloride: 103 meq/L (ref 96–112)
Creatinine, Ser: 0.94 mg/dL (ref 0.40–1.50)
GFR: 94.95 mL/min (ref >60.00)
Glucose, Bld: 94 mg/dL (ref 70–99)
Potassium: 3.6 meq/L (ref 3.5–5.1)
Sodium: 141 meq/L (ref 135–145)
Total Bilirubin: 0.6 mg/dL (ref 0.2–1.2)
Total Protein: 6.7 g/dL (ref 6.0–8.3)

## 2023-07-12 LAB — PSA: PSA: 0.68 ng/mL (ref 0.10–4.00)

## 2023-07-12 LAB — CBC
HCT: 44.1 % (ref 39.0–52.0)
Hemoglobin: 14.6 g/dL (ref 13.0–17.0)
MCHC: 33 g/dL (ref 30.0–36.0)
MCV: 87.2 fL (ref 78.0–100.0)
Platelets: 182 10*3/uL (ref 150.0–400.0)
RBC: 5.06 Mil/uL (ref 4.22–5.81)
RDW: 14.3 % (ref 11.5–15.5)
WBC: 6.9 10*3/uL (ref 4.0–10.5)

## 2023-07-12 LAB — HEMOGLOBIN A1C: Hgb A1c MFr Bld: 6.2 % (ref 4.6–6.5)

## 2023-07-12 LAB — VITAMIN B12: Vitamin B-12: 861 pg/mL (ref 211–911)

## 2023-07-12 LAB — LIPID PANEL
Cholesterol: 126 mg/dL (ref 0–200)
HDL: 50.7 mg/dL (ref >39.00)
LDL Cholesterol: 56 mg/dL (ref 0–99)
NonHDL: 75.59
Total CHOL/HDL Ratio: 2
Triglycerides: 96 mg/dL (ref 0.0–149.0)
VLDL: 19.2 mg/dL (ref 0.0–40.0)

## 2023-07-28 ENCOUNTER — Ambulatory Visit: Payer: BC Managed Care – PPO | Attending: Cardiology

## 2023-07-28 DIAGNOSIS — Z7901 Long term (current) use of anticoagulants: Secondary | ICD-10-CM

## 2023-07-28 DIAGNOSIS — Z952 Presence of prosthetic heart valve: Secondary | ICD-10-CM

## 2023-07-28 LAB — POCT INR: INR: 1.8 — AB (ref 2.0–3.0)

## 2023-07-28 NOTE — Patient Instructions (Signed)
Description   Continue on same dosage of Warfarin 1 tablet daily. Next INR in 6 weeks.  Call clinic at [phone] for changes in medication or procedures

## 2023-08-29 ENCOUNTER — Other Ambulatory Visit: Payer: Self-pay | Admitting: Family Medicine

## 2023-08-29 DIAGNOSIS — E785 Hyperlipidemia, unspecified: Secondary | ICD-10-CM

## 2023-09-04 NOTE — Progress Notes (Deleted)
 PATIENT: Larry Parks DOB: October 17, 1975  REASON FOR VISIT: follow up HISTORY FROM: patient PRIMARY NEUROLOGIST: Dr. Buck  Virtual Visit via Video Note  I connected with Thaddaeus Dubray on 09/04/23 at  8:15 AM EST by a video enabled telemedicine application located remotely at [facility] and verified that I am speaking with the correct person using two identifiers who was located at their own home.   I discussed the limitations of evaluation and management by telemedicine and the availability of in person appointments. The patient expressed understanding and agreed to proceed.   PATIENT: Larry Parks DOB: 11/22/1975  REASON FOR VISIT: follow up HISTORY FROM: patient  HISTORY OF PRESENT ILLNESS: Today 09/04/23:  Bobbyjoe Bruster is a 48 y.o. male with a history of OSA on CPAP. Returns today for follow-up.      06/01/22: Mr. Roselli is a 48 year old male with a history of obstructive sleep apnea on CPAP.  His download indicates that he uses machine nightly for compliance of 100%.  He uses machine greater than 4 hours each night.  His average usage is 7 hours and 18 minutes.  His residual AHI is 4.1 on 8 to 20 cm of water with EPR of 3.  He does report that there are times he feels that the pressure is too high.  Overall he feels that he is doing well.  Returns today for evaluation.  REVIEW OF SYSTEMS: Out of a complete 14 system review of symptoms, the patient complains only of the following symptoms, and all other reviewed systems are negative.  ALLERGIES: Allergies  Allergen Reactions   Bee Pollen Itching   Pollen Extract Itching    HOME MEDICATIONS: Outpatient Medications Prior to Visit  Medication Sig Dispense Refill   Cholecalciferol (VITAMIN D3) 125 MCG (5000 UT) CAPS Take 1 capsule by mouth daily.     co-enzyme Q-10 30 MG capsule Take 30 mg by mouth 3 (three) times daily.     cyanocobalamin  100 MCG tablet Take 100 mcg by mouth daily.      diclofenac  Sodium (VOLTAREN ) 1 % GEL Apply a small grape sized dollop to sore place on foot 3 times daily as needed. (Patient not taking: Reported on 07/07/2023) 150 g 2   ezetimibe  (ZETIA ) 10 MG tablet TAKE 1 TABLET (10 MG TOTAL) BY MOUTH DAILY. 90 tablet 3   lisinopril (ZESTRIL) 5 MG tablet Take 5 mg by mouth daily.     melatonin 3 MG TABS tablet Take 1 mg by mouth at bedtime.     Omega-3 Fatty Acids (FISH OIL PO) Take 1 tablet by mouth daily. 2 capsules twice a day     rosuvastatin  (CRESTOR ) 10 MG tablet TAKE 1 TABLET (10 MG TOTAL) BY MOUTH DAILY. 30 tablet 0   tamsulosin  (FLOMAX ) 0.4 MG CAPS capsule TAKE 1 CAPSULE (0.4 MG TOTAL) BY MOUTH DAILY. 90 capsule 3   warfarin (COUMADIN ) 5 MG tablet TAKE 1 TABLET DAILY EXCEPT 1.5 TABLETS EACH SUNDAY, WEDNESDAY AND SATURDAY OR AS DIRECTED BY ANTICOAGULATION CLINIC. 140 tablet 1   No facility-administered medications prior to visit.    PAST MEDICAL HISTORY: Past Medical History:  Diagnosis Date   Allergy    Angioedema of lips 10/22/2019   Anxiety    Depression    Dissecting aortic aneurysm (any part), thoracic (HCC)    2019   Dyslipidemia 03/12/2019   Hepatic steatosis 11/03/2017   Formatting of this note might be different from the original. 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 11/29/2017 Aortic root replacement with #23 OnX mechanical composite prosthesis with coronary reconstruction and graft replacement of ascending aorta with #26 Hemashield (Dr.Rowe).   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/2019    PAST SURGICAL HISTORY: Past Surgical History:   Procedure Laterality Date   aortic valve replaced  11/2017   aortic aneurism   CARDIAC VALVE REPLACEMENT  11/29/2017    FAMILY HISTORY: Family History  Problem Relation Age of Onset   Diabetes Father    Heart attack Father    Heart disease Father    Colon cancer Neg Hx    Esophageal cancer Neg Hx    Rectal cancer Neg Hx    Stomach cancer Neg Hx    Prostate cancer Neg Hx     SOCIAL HISTORY: Social History   Socioeconomic History   Marital status: Married    Spouse name: Not on file   Number of children: 2   Years of education: Not on file   Highest education level: Doctorate  Occupational History   Occupation: Assoc. prof. @ [employer]  Tobacco Use   Smoking status: Former    Current packs/day: 0.00    Average packs/day: 0.5 packs/day for 10.0 years (5.0 ttl pk-yrs)    Types: Cigarettes    Quit date: 11/24/2002    Years since quitting: 20.7   Smokeless tobacco: Never   Tobacco comments:    Don't smoke currently at all  Vaping Use   Vaping status: Never Used  Substance and Sexual Activity   Alcohol use: Not Currently    Comment: socially - last 2008   Drug use: Never   Sexual activity: Yes    Birth control/protection: Condom  Other Topics Concern   Not on file  Social History Narrative   Caffeine: 2 cups/day   Social Drivers of Corporate Investment Banker Strain: Low Risk  (07/07/2023)   Overall Financial Resource Strain (CARDIA)    Difficulty of Paying Living Expenses: Not very hard  Food Insecurity: No Food Insecurity (07/07/2023)   Hunger Vital Sign    Worried About Running Out of Food in the Last Year: Never true    Ran Out of Food in the Last Year: Never true  Transportation Needs: No Transportation Needs (07/07/2023)   PRAPARE - Administrator, Civil Service (Medical): No    Lack of Transportation (Non-Medical): No  Physical Activity: Sufficiently Active (07/07/2023)   Exercise Vital Sign    Days of Exercise per Week: 4 days    Minutes  of Exercise per Session: 60 min  Stress: No Stress Concern Present (07/07/2023)   [employer] of Occupational Health - Occupational Stress Questionnaire    Feeling of Stress : Only a little  Social Connections: Socially Integrated (07/07/2023)   Social Connection and Isolation Panel [NHANES]    Frequency of Communication with Friends and Family: More than three times a week    Frequency of Social Gatherings with Friends and Family: Once a week    Attends Religious Services: More than 4 times per year  Active Member of Clubs or Organizations: Yes    Attends Banker Meetings: More than 4 times per year    Marital Status: Married  Intimate Partner Violence: Unknown (04/08/2023)   Received from [facility]   HITS    Physically Hurt: Not on file    Insult or Talk Down To: Not on file    Threaten Physical Harm: Not on file    Scream or Curse: Not on file      PHYSICAL EXAM Generalized: Well developed, in no acute distress   Neurological examination  Mentation: Alert oriented to time, place, history taking. Follows all commands speech and language fluent Cranial nerve II-XII:Extraocular movements were full. Facial symmetry noted. Head turning and shoulder shrug  were normal and symmetric. Reflexes: UTA  DIAGNOSTIC DATA (LABS, IMAGING, TESTING) - I reviewed patient records, labs, notes, testing and imaging myself where available.  Lab Results  Component Value Date   WBC 6.9 07/12/2023   HGB 14.6 07/12/2023   HCT 44.1 07/12/2023   MCV 87.2 07/12/2023   PLT 182.0 07/12/2023      Component Value Date/Time   NA 141 07/12/2023 1015   NA 145 (H) 09/13/2022 1349   K 3.6 07/12/2023 1015   CL 103 07/12/2023 1015   CO2 30 07/12/2023 1015   GLUCOSE 94 07/12/2023 1015   BUN 15 07/12/2023 1015   BUN 13 09/13/2022 1349   CREATININE 0.94 07/12/2023 1015   CALCIUM  9.4 07/12/2023 1015   PROT 6.7 07/12/2023 1015   PROT 6.7 06/24/2021 1039   ALBUMIN 4.4 07/12/2023  1015   ALBUMIN 4.7 06/24/2021 1039   AST 20 07/12/2023 1015   ALT 23 07/12/2023 1015   ALKPHOS 53 07/12/2023 1015   BILITOT 0.6 07/12/2023 1015   BILITOT 0.3 06/24/2021 1039   GFRNONAA 107 04/18/2020 1645   GFRAA 123 04/18/2020 1645   Lab Results  Component Value Date   CHOL 126 07/12/2023   HDL 50.70 07/12/2023   LDLCALC 56 07/12/2023   TRIG 96.0 07/12/2023   CHOLHDL 2 07/12/2023   Lab Results  Component Value Date   HGBA1C 6.2 07/12/2023       ASSESSMENT AND PLAN 48 y.o. year old male  has a past medical history of Allergy, Angioedema of lips (10/22/2019), Anxiety, Depression, Dissecting aortic aneurysm (any part), thoracic (HCC), Dyslipidemia (03/12/2019), Hepatic steatosis (11/03/2017), Hyperlipidemia, Hypertension, Hypertriglyceridemia (04/23/2020), Other allergic rhinitis (11/14/2019), Pruritic rash (11/14/2019), Screening for diabetes mellitus (07/07/2023), Sleep apnea, Status post aortic valve replacement (03/12/2019), Status post ascending aortic aneurysm repair (12/03/2017), and Status post thoracic aortic aneurysm repair (09/17/2019). here with:  OSA on CPAP  CPAP compliance excellent Residual AHI is good Change pressure 8-18 cmH2O EPR 3 Encouraged patient to continue using CPAP nightly and > 4 hours each night F/U in 1 year or sooner if needed   Duwaine Russell, MSN, NP-C 09/04/2023, 8:39 AM [facility] [address], [address] [place], [place] [address] [phone]

## 2023-09-06 ENCOUNTER — Telehealth: Payer: 59 | Admitting: Adult Health

## 2023-09-08 ENCOUNTER — Ambulatory Visit: Payer: 59 | Attending: Cardiology

## 2023-09-08 DIAGNOSIS — Z952 Presence of prosthetic heart valve: Secondary | ICD-10-CM | POA: Diagnosis not present

## 2023-09-08 DIAGNOSIS — Z7901 Long term (current) use of anticoagulants: Secondary | ICD-10-CM | POA: Diagnosis not present

## 2023-09-08 LAB — POCT INR: INR: 1.3 — AB (ref 2.0–3.0)

## 2023-09-08 NOTE — Patient Instructions (Signed)
Description   Take 1.5 tablets today and then continue on same dosage of Warfarin 1 tablet daily. Next INR in 4 weeks.  Call clinic at [phone] for changes in medication or procedures

## 2023-09-22 ENCOUNTER — Encounter: Payer: Self-pay | Admitting: *Deleted

## 2023-09-23 ENCOUNTER — Telehealth (INDEPENDENT_AMBULATORY_CARE_PROVIDER_SITE_OTHER): Payer: 59 | Admitting: Adult Health

## 2023-09-23 DIAGNOSIS — G4733 Obstructive sleep apnea (adult) (pediatric): Secondary | ICD-10-CM | POA: Diagnosis not present

## 2023-09-23 NOTE — Progress Notes (Signed)
PATIENT: Larry Parks DOB: 1976/01/18  REASON FOR VISIT: follow up HISTORY FROM: patient PRIMARY NEUROLOGIST: Dr. Frances Furbish  Virtual Visit via Video Note  I connected with Larry Parks on 09/23/23 at 11:00 AM EST by a video enabled telemedicine application located remotely at [facility] and verified that I am speaking with the correct person using two identifiers who was located at their own home.   I discussed the limitations of evaluation and management by telemedicine and the availability of in person appointments. The patient expressed understanding and agreed to proceed.   PATIENT: Larry Parks DOB: [birth year]  REASON FOR VISIT: follow up HISTORY FROM: patient  HISTORY OF PRESENT ILLNESS: Today 09/23/23:  Larry Parks is a 48 y.o. male with a history of OSA on CPAP. Returns today for follow-up. Reports that CPAP is working well. Continues to find it beneficial.  He states that he was given a prescription for new machine last year however he never purchased.  He would like a printed prescription sent to him through MyChart or he will pick it up at the office for him to go through his on CPAP dealer.     06/01/22: [name] is a 48 year old male with a history of obstructive sleep apnea on CPAP.  His download indicates that he uses machine nightly for compliance of 100%.  He uses machine greater than 4 hours each night.  His average usage is 7 hours and 18 minutes.  His residual AHI is 4.1 on 8 to 20 cm of water with EPR of 3.  He does report that there are times he feels that the pressure is too high.  Overall he feels that he is doing well.  Returns today for evaluation.  REVIEW OF SYSTEMS: Out of a complete 14 system review of symptoms, the patient complains only of the following symptoms, and all other reviewed systems are negative.  ALLERGIES: Allergies  Allergen Reactions   Bee Pollen Itching   Pollen Extract Itching    HOME  MEDICATIONS: Outpatient Medications Prior to Visit  Medication Sig Dispense Refill   Cholecalciferol (VITAMIN D3) 125 MCG (5000 UT) CAPS Take 1 capsule by mouth daily.     co-enzyme Q-10 30 MG capsule Take 30 mg by mouth 3 (three) times daily.     cyanocobalamin 100 MCG tablet Take 100 mcg by mouth daily.     diclofenac Sodium (VOLTAREN) 1 % GEL Apply a small grape sized dollop to sore place on foot 3 times daily as needed. (Patient not taking: Reported on 07/07/2023) 150 g 2   ezetimibe (ZETIA) 10 MG tablet TAKE 1 TABLET (10 MG TOTAL) BY MOUTH DAILY. 90 tablet 3   lisinopril (ZESTRIL) 5 MG tablet Take 5 mg by mouth daily.     melatonin 3 MG TABS tablet Take 1 mg by mouth at bedtime.     Omega-3 Fatty Acids (FISH OIL PO) Take 1 tablet by mouth daily. 2 capsules twice a day     rosuvastatin (CRESTOR) 10 MG tablet TAKE 1 TABLET (10 MG TOTAL) BY MOUTH DAILY. 30 tablet 0   tamsulosin (FLOMAX) 0.4 MG CAPS capsule TAKE 1 CAPSULE (0.4 MG TOTAL) BY MOUTH DAILY. 90 capsule 3   warfarin (COUMADIN) 5 MG tablet TAKE 1 TABLET DAILY EXCEPT 1.5 TABLETS EACH SUNDAY, WEDNESDAY AND SATURDAY OR AS DIRECTED BY ANTICOAGULATION CLINIC. 140 tablet 1   No facility-administered medications prior to visit.    PAST MEDICAL HISTORY: Past Medical History:  Diagnosis Date  Allergy    Angioedema of lips 10/22/2019   Anxiety    Depression    Dissecting aortic aneurysm (any part), thoracic (HCC)    2019   Dyslipidemia 03/12/2019   Hepatic steatosis 11/03/2017   Formatting of this note might be different from the original. 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 11/29/2017 Aortic root replacement with #23 OnX mechanical composite prosthesis with coronary reconstruction and graft replacement of ascending aorta with #26 Hemashield (Dr.Rowe).   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/2019    PAST SURGICAL HISTORY: Past Surgical History:  Procedure Laterality Date   aortic valve replaced  11/2017   aortic aneurism   CARDIAC VALVE REPLACEMENT  11/29/2017    FAMILY HISTORY: Family History  Problem Relation Age of Onset   Diabetes Father    Heart attack Father    Heart disease Father    Colon cancer Neg Hx    Esophageal cancer Neg Hx    Rectal cancer Neg Hx    Stomach cancer Neg Hx    Prostate cancer Neg Hx     SOCIAL HISTORY: Social History   Socioeconomic History   Marital status: Married    Spouse name: Not on file   Number of children: 2   Years of education: Not on file   Highest education level: Doctorate  Occupational History   Occupation: Assoc. prof. @ [employer]  Tobacco Use   Smoking status: Former    Current packs/day: 0.00    Average packs/day: 0.5 packs/day for 10.0 years (5.0 ttl pk-yrs)    Types: Cigarettes    Quit date: 11/24/2002    Years since quitting: 20.8   Smokeless tobacco: Never   Tobacco comments:    Don't smoke currently at all  Vaping Use   Vaping status: Never Used  Substance and Sexual Activity   Alcohol use: Not Currently    Comment: socially - last 2008   Drug use: Never   Sexual activity: Yes    Birth control/protection: Condom  Other Topics Concern   Not on file  Social History Narrative   Caffeine: 2 cups/day   Social Drivers of Corporate investment banker Strain: Low Risk  (07/07/2023)   Overall Financial Resource Strain (CARDIA)    Difficulty of Paying Living Expenses: Not very hard  Food Insecurity: No Food Insecurity (07/07/2023)   Hunger Vital Sign    Worried About Running Out of Food in the Last Year: Never true    Ran Out of Food in the Last  Year: Never true  Transportation Needs: No Transportation Needs (07/07/2023)   PRAPARE - Administrator, Civil Service (Medical): No    Lack of Transportation (Non-Medical): No  Physical Activity: Sufficiently Active (07/07/2023)   Exercise Vital Sign    Days of Exercise per Week: 4 days    Minutes of Exercise per Session: 60 min  Stress: No Stress Concern Present (07/07/2023)   [employer] of Occupational Health - Occupational Stress Questionnaire    Feeling of Stress : Only a little  Social Connections: Socially  Integrated (07/07/2023)   Social Connection and Isolation Panel [NHANES]    Frequency of Communication with Friends and Family: More than three times a week    Frequency of Social Gatherings with Friends and Family: Once a week    Attends Religious Services: More than 4 times per year    Active Member of [employer] or Organizations: Yes    Attends Banker Meetings: More than 4 times per year    Marital Status: Married  Catering manager Violence: Unknown (04/08/2023)   Received from [facility]   HITS    Physically Hurt: Not on file    Insult or Talk Down To: Not on file    Threaten Physical Harm: Not on file    Scream or Curse: Not on file      PHYSICAL EXAM Generalized: Well developed, in no acute distress   Neurological examination  Mentation: Alert oriented to time, place, history taking. Follows all commands speech and language fluent Cranial nerve II-XII:Extraocular movements were full. Facial symmetry noted. Head turning and shoulder shrug  were normal and symmetric. Reflexes: UTA  DIAGNOSTIC DATA (LABS, IMAGING, TESTING) - I reviewed patient records, labs, notes, testing and imaging myself where available.  Lab Results  Component Value Date   WBC 6.9 07/12/2023   HGB 14.6 07/12/2023   HCT 44.1 07/12/2023   MCV 87.2 07/12/2023   PLT 182.0 07/12/2023      Component Value Date/Time   NA 141 07/12/2023 1015   NA 145 (H)  09/13/2022 1349   K 3.6 07/12/2023 1015   CL 103 07/12/2023 1015   CO2 30 07/12/2023 1015   GLUCOSE 94 07/12/2023 1015   BUN 15 07/12/2023 1015   BUN 13 09/13/2022 1349   CREATININE 0.94 07/12/2023 1015   CALCIUM 9.4 07/12/2023 1015   PROT 6.7 07/12/2023 1015   PROT 6.7 06/24/2021 1039   ALBUMIN 4.4 07/12/2023 1015   ALBUMIN 4.7 06/24/2021 1039   AST 20 07/12/2023 1015   ALT 23 07/12/2023 1015   ALKPHOS 53 07/12/2023 1015   BILITOT 0.6 07/12/2023 1015   BILITOT 0.3 06/24/2021 1039   GFRNONAA 107 04/18/2020 1645   GFRAA 123 04/18/2020 1645   Lab Results  Component Value Date   CHOL 126 07/12/2023   HDL 50.70 07/12/2023   LDLCALC 56 07/12/2023   TRIG 96.0 07/12/2023   CHOLHDL 2 07/12/2023   Lab Results  Component Value Date   HGBA1C 6.2 07/12/2023       ASSESSMENT AND PLAN 48 y.o. year old male  has a past medical history of Allergy, Angioedema of lips (10/22/2019), Anxiety, Depression, Dissecting aortic aneurysm (any part), thoracic (HCC), Dyslipidemia (03/12/2019), Hepatic steatosis (11/03/2017), Hyperlipidemia, Hypertension, Hypertriglyceridemia (04/23/2020), Other allergic rhinitis (11/14/2019), Pruritic rash (11/14/2019), Screening for diabetes mellitus (07/07/2023), Sleep apnea, Status post aortic valve replacement (03/12/2019), Status post ascending aortic aneurysm repair (12/03/2017), and Status post thoracic aortic aneurysm repair (09/17/2019). here with:  OSA on CPAP  CPAP compliance excellent Residual AHI is good Encouraged patient to continue using CPAP nightly and > 4 hours each night Will place an order for new machine.  Patient plans to purchase out-of-pocket F/U in 1 year or sooner if needed   Butch Penny, MSN, NP-C 09/23/2023, 11:19 AM [facility] [address], [address] [place], [place] [address] [phone]

## 2023-09-26 ENCOUNTER — Encounter: Payer: Self-pay | Admitting: *Deleted

## 2023-09-26 ENCOUNTER — Telehealth: Payer: Self-pay | Admitting: *Deleted

## 2023-09-26 NOTE — Telephone Encounter (Signed)
-----   Message from Butch Penny sent at 09/23/2023 11:39 AM EST ----- Order placed for new machine however he wants the order printed and placed in his MyChart as he uses a different vendor not typical DME company

## 2023-10-06 ENCOUNTER — Ambulatory Visit: Payer: 59 | Attending: Cardiology

## 2023-10-06 DIAGNOSIS — Z7901 Long term (current) use of anticoagulants: Secondary | ICD-10-CM

## 2023-10-06 DIAGNOSIS — Z952 Presence of prosthetic heart valve: Secondary | ICD-10-CM | POA: Diagnosis not present

## 2023-10-06 LAB — POCT INR: INR: 1.5 — AB (ref 2.0–3.0)

## 2023-10-06 NOTE — Patient Instructions (Signed)
Description   Continue on same dosage of Warfarin 1 tablet daily. Next INR in 5 weeks.  Call clinic at [phone] for changes in medication or procedures

## 2023-10-07 ENCOUNTER — Other Ambulatory Visit: Payer: Self-pay | Admitting: Cardiology

## 2023-10-07 DIAGNOSIS — Z952 Presence of prosthetic heart valve: Secondary | ICD-10-CM

## 2023-10-07 DIAGNOSIS — Z7901 Long term (current) use of anticoagulants: Secondary | ICD-10-CM

## 2023-10-07 NOTE — Telephone Encounter (Signed)
Warfarin 5mg  refill Status post aortic valve replacement [Z95.2]  Last INR 10/07/23 Last OV 08/25/22

## 2023-11-10 ENCOUNTER — Ambulatory Visit: Payer: 59 | Attending: Cardiovascular Disease

## 2023-11-10 DIAGNOSIS — Z7901 Long term (current) use of anticoagulants: Secondary | ICD-10-CM

## 2023-11-10 DIAGNOSIS — Z952 Presence of prosthetic heart valve: Secondary | ICD-10-CM

## 2023-11-10 LAB — POCT INR: INR: 1.7 — AB (ref 2.0–3.0)

## 2023-11-10 NOTE — Patient Instructions (Signed)
 Description   Continue on same dosage of Warfarin 1 tablet daily. Next INR in 6 weeks.  Call clinic at [phone] for changes in medication or procedures

## 2023-12-19 ENCOUNTER — Encounter: Payer: Self-pay | Admitting: Family Medicine

## 2023-12-27 ENCOUNTER — Ambulatory Visit: Admitting: Family Medicine

## 2024-01-03 ENCOUNTER — Ambulatory Visit: Payer: BC Managed Care – PPO | Admitting: Family Medicine

## 2024-03-12 ENCOUNTER — Other Ambulatory Visit: Payer: Self-pay | Admitting: Family Medicine

## 2024-03-12 DIAGNOSIS — N401 Enlarged prostate with lower urinary tract symptoms: Secondary | ICD-10-CM

## 2024-03-13 ENCOUNTER — Telehealth: Admitting: Family Medicine

## 2024-03-13 DIAGNOSIS — N401 Enlarged prostate with lower urinary tract symptoms: Secondary | ICD-10-CM | POA: Diagnosis not present

## 2024-03-13 DIAGNOSIS — R351 Nocturia: Secondary | ICD-10-CM | POA: Diagnosis not present

## 2024-03-13 MED ORDER — TAMSULOSIN HCL 0.4 MG PO CAPS: 0.4000 mg | ORAL_CAPSULE | Freq: Every day | ORAL | 3 refills | Status: AC

## 2024-03-13 NOTE — Progress Notes (Signed)
 Established Patient Office Visit   Subjective:  Patient ID: Larry Parks, male    DOB: 08-23-1976  Age: 48 y.o. MRN: [id number]  No chief complaint on file.   HPI Encounter Diagnoses  Name Primary?   Benign prostatic hyperplasia with nocturia Yes   For follow-up of above.  He is doing well with the tamsulosin .  Urine flow has improved.  There is no longer hesitancy or urgency.  There are no sexual side effects.  He still experiences nocturia x 1 or 2 if he has fluids right before bedtime.  This does not happen when he fluid restricts a few hours before bedtime.   Review of Systems  Constitutional: Negative.   HENT: Negative.    Eyes:  Negative for blurred vision, discharge and redness.  Respiratory: Negative.    Cardiovascular: Negative.   Gastrointestinal:  Negative for abdominal pain.  Genitourinary: Negative.   Musculoskeletal: Negative.  Negative for myalgias.  Skin:  Negative for rash.  Neurological:  Negative for tingling, loss of consciousness and weakness.  Endo/Heme/Allergies:  Negative for polydipsia.     Current Outpatient Medications:    Cholecalciferol (VITAMIN D3) 125 MCG (5000 UT) CAPS, Take 1 capsule by mouth daily., Disp: , Rfl:    co-enzyme Q-10 30 MG capsule, Take 30 mg by mouth 3 (three) times daily., Disp: , Rfl:    cyanocobalamin  100 MCG tablet, Take 100 mcg by mouth daily., Disp: , Rfl:    diclofenac  Sodium (VOLTAREN ) 1 % GEL, Apply a small grape sized dollop to sore place on foot 3 times daily as needed. (Patient not taking: Reported on 07/07/2023), Disp: 150 g, Rfl: 2   ezetimibe  (ZETIA ) 10 MG tablet, TAKE 1 TABLET (10 MG TOTAL) BY MOUTH DAILY., Disp: 90 tablet, Rfl: 3   lisinopril (ZESTRIL) 5 MG tablet, Take 5 mg by mouth daily., Disp: , Rfl:    melatonin 3 MG TABS tablet, Take 1 mg by mouth at bedtime., Disp: , Rfl:    Omega-3 Fatty Acids (FISH OIL PO), Take 1 tablet by mouth daily. 2 capsules twice a day, Disp: , Rfl:    rosuvastatin  (CRESTOR )  10 MG tablet, TAKE 1 TABLET (10 MG TOTAL) BY MOUTH DAILY., Disp: 30 tablet, Rfl: 0   tamsulosin  (FLOMAX ) 0.4 MG CAPS capsule, Take 1 capsule (0.4 mg total) by mouth daily., Disp: 90 capsule, Rfl: 3   warfarin (COUMADIN ) 5 MG tablet, TAKE 1 TABLET DAILY OR AS DIRECTED BY ANTICOAGULATION CLINIC., Disp: 100 tablet, Rfl: 1   Objective:     There were no vitals taken for this visit.   Physical Exam Constitutional:      General: He is not in acute distress.    Appearance: Normal appearance. He is not ill-appearing, toxic-appearing or diaphoretic.  HENT:     Head: Normocephalic and atraumatic.     Right Ear: External ear normal.     Left Ear: External ear normal.  Eyes:     General: No scleral icterus.       Right eye: No discharge.        Left eye: No discharge.     Extraocular Movements: Extraocular movements intact.     Conjunctiva/sclera: Conjunctivae normal.  Pulmonary:     Effort: Pulmonary effort is normal. No respiratory distress.  Skin:    General: Skin is warm and dry.  Neurological:     Mental Status: He is alert and oriented to person, place, and time.  Psychiatric:  Mood and Affect: Mood normal.        Behavior: Behavior normal.      No results found for any visits on 03/13/24.    The 10-year ASCVD risk score (Arnett DK, et al., 2019) is: 2.6%    Assessment & Plan:   Benign prostatic hyperplasia with nocturia -     Tamsulosin  HCl; Take 1 capsule (0.4 mg total) by mouth daily.  Dispense: 90 capsule; Refill: 3    Return Has scheduled appointment for physical in November., for annual physical, chronic disease follow-up.    Elsie Sim Lent, MD  Virtual Visit via Video Note  I connected with Larry Parks on 03/13/24 at  3:40 PM EDT by a video enabled telemedicine application and verified that I am speaking with the correct person using two identifiers.  Location: Patient: at home with his wife.   Provider: work   I discussed the  limitations of evaluation and management by telemedicine and the availability of in person appointments. The patient expressed understanding and agreed to proceed.  History of Present Illness:    Observations/Objective:   Assessment and Plan:   Follow Up Instructions:    I discussed the assessment and treatment plan with the patient. The patient was provided an opportunity to ask questions and all were answered. The patient agreed with the plan and demonstrated an understanding of the instructions.   The patient was advised to call back or seek an in-person evaluation if the symptoms worsen or if the condition fails to improve as anticipated.  I provided 20 minutes of non-face-to-face time during this encounter.   Elsie Sim Lent, MD

## 2024-03-25 ENCOUNTER — Encounter: Payer: Self-pay | Admitting: Family Medicine

## 2024-03-26 ENCOUNTER — Other Ambulatory Visit: Payer: Self-pay | Admitting: Cardiology

## 2024-03-26 DIAGNOSIS — Z952 Presence of prosthetic heart valve: Secondary | ICD-10-CM

## 2024-03-26 DIAGNOSIS — Z7901 Long term (current) use of anticoagulants: Secondary | ICD-10-CM

## 2024-03-26 NOTE — Telephone Encounter (Signed)
 Warfarin 5mg  refill Status post aortic valve replacement  Last INR 11/10/23 and per chart been going to [facility] Last OV 08/25/22  Spoke with [facility] pharmacy and advised that the patient is being followed by Dr. Erie with [facility] so she changed it in the computer and states she will send to Dr Erie office.

## 2024-03-27 ENCOUNTER — Ambulatory Visit: Admitting: Family Medicine

## 2024-03-27 ENCOUNTER — Encounter: Payer: Self-pay | Admitting: Family Medicine

## 2024-03-27 VITALS — BP 136/78 | HR 94 | Temp 97.5°F | Ht 66.0 in | Wt 200.0 lb

## 2024-03-27 DIAGNOSIS — R7303 Prediabetes: Secondary | ICD-10-CM

## 2024-03-27 DIAGNOSIS — E6609 Other obesity due to excess calories: Secondary | ICD-10-CM

## 2024-03-27 DIAGNOSIS — Z6832 Body mass index (BMI) 32.0-32.9, adult: Secondary | ICD-10-CM | POA: Diagnosis not present

## 2024-03-27 DIAGNOSIS — E66811 Obesity, class 1: Secondary | ICD-10-CM

## 2024-03-27 MED ORDER — METFORMIN HCL ER 500 MG PO TB24: 500.0000 mg | ORAL_TABLET | Freq: Every evening | ORAL | 1 refills | Status: DC

## 2024-03-27 NOTE — Progress Notes (Unsigned)
 Established Patient Office Visit   Subjective:  Patient ID: Larry Parks, male    DOB: 10-26-1975  Age: 48 y.o. MRN: [id number]  Chief Complaint  Patient presents with   Diabetes    Pt is in office due to home blood sugar reading have been going up and down.     Diabetes Pertinent negatives for diabetes include no blurred vision, no polydipsia and no weakness.   Encounter Diagnoses  Name Primary?   Prediabetes Yes   Class 1 obesity due to excess calories with body mass index (BMI) of 32.0 to 32.9 in adult, unspecified whether serious comorbidity present    Follow-up of above.  He has been checking his blood sugars.  Most of the fasting sugars are less than 140.  Most of the 2-hour postprandial are in the less than 160 range.  He is doing intermittent fasting where he has an evening meal but does not eat until the next morning.  He is exercising by walking and going to the gym. {History (Optional):23778}  Review of Systems  Constitutional: Negative.   HENT: Negative.    Eyes:  Negative for blurred vision, discharge and redness.  Respiratory: Negative.    Cardiovascular: Negative.   Gastrointestinal:  Negative for abdominal pain.  Genitourinary: Negative.   Musculoskeletal: Negative.  Negative for myalgias.  Skin:  Negative for rash.  Neurological:  Negative for tingling, loss of consciousness and weakness.  Endo/Heme/Allergies:  Negative for polydipsia.     Current Outpatient Medications:    Cholecalciferol (VITAMIN D3) 125 MCG (5000 UT) CAPS, Take 1 capsule by mouth daily., Disp: , Rfl:    co-enzyme Q-10 30 MG capsule, Take 30 mg by mouth 3 (three) times daily., Disp: , Rfl:    cyanocobalamin  100 MCG tablet, Take 100 mcg by mouth daily., Disp: , Rfl:    ezetimibe  (ZETIA ) 10 MG tablet, TAKE 1 TABLET (10 MG TOTAL) BY MOUTH DAILY., Disp: 90 tablet, Rfl: 3   lisinopril (ZESTRIL) 5 MG tablet, Take 5 mg by mouth daily., Disp: , Rfl:    melatonin 3 MG TABS tablet, Take 1 mg by  mouth at bedtime., Disp: , Rfl:    metFORMIN  (GLUCOPHAGE -XR) 500 MG 24 hr tablet, Take 1 tablet (500 mg total) by mouth at bedtime., Disp: 90 tablet, Rfl: 1   Omega-3 Fatty Acids (FISH OIL PO), Take 1 tablet by mouth daily. 2 capsules twice a day, Disp: , Rfl:    rosuvastatin  (CRESTOR ) 10 MG tablet, TAKE 1 TABLET (10 MG TOTAL) BY MOUTH DAILY., Disp: 30 tablet, Rfl: 0   tamsulosin  (FLOMAX ) 0.4 MG CAPS capsule, Take 1 capsule (0.4 mg total) by mouth daily., Disp: 90 capsule, Rfl: 3   warfarin (COUMADIN ) 5 MG tablet, TAKE 1 TABLET DAILY OR AS DIRECTED BY ANTICOAGULATION CLINIC., Disp: 100 tablet, Rfl: 1   diclofenac  Sodium (VOLTAREN ) 1 % GEL, Apply a small grape sized dollop to sore place on foot 3 times daily as needed. (Patient not taking: Reported on 03/27/2024), Disp: 150 g, Rfl: 2   Objective:     BP 136/78 (BP Location: Right Arm, Patient Position: Sitting, Cuff Size: Normal)   Pulse 94   Temp (!) 97.5 F (36.4 C) (Temporal)   Ht 5' 6 (1.676 m)   Wt 200 lb (90.7 kg)   SpO2 96%   BMI 32.28 kg/m  Wt Readings from Last 3 Encounters:  03/27/24 200 lb (90.7 kg)  07/07/23 187 lb 12.8 oz (85.2 kg)  09/28/22 183 lb (83  kg)      Physical Exam Constitutional:      General: He is not in acute distress.    Appearance: Normal appearance. He is not ill-appearing, toxic-appearing or diaphoretic.  HENT:     Head: Normocephalic and atraumatic.     Right Ear: External ear normal.     Left Ear: External ear normal.  Eyes:     General: No scleral icterus.       Right eye: No discharge.        Left eye: No discharge.     Extraocular Movements: Extraocular movements intact.     Conjunctiva/sclera: Conjunctivae normal.  Pulmonary:     Effort: Pulmonary effort is normal. No respiratory distress.  Skin:    General: Skin is warm and dry.  Neurological:     Mental Status: He is alert and oriented to person, place, and time.  Psychiatric:        Mood and Affect: Mood normal.        Behavior:  Behavior normal.      No results found for any visits on 03/27/24.  {Labs (Optional):23779}  The 10-year ASCVD risk score (Arnett DK, et al., 2019) is: 2.6%    Assessment & Plan:   Prediabetes -     Basic metabolic panel with GFR -     Hemoglobin A1c -     metFORMIN  HCl ER; Take 1 tablet (500 mg total) by mouth at bedtime.  Dispense: 90 tablet; Refill: 1 -     Amb ref to Medical Nutrition Therapy-MNT  Class 1 obesity due to excess calories with body mass index (BMI) of 32.0 to 32.9 in adult, unspecified whether serious comorbidity present -     Amb ref to Medical Nutrition Therapy-MNT    Return in about 3 months (around 06/27/2024).  Will start metformin  XR at at bedtime.  Advised that there may be some cramping and loose stools that passes for most people after few weeks.  Nutritional consultation.  Continue exercise and weight loss efforts.  Advised that we are hoping for fasting sugars to be less than 140 but greater than 90 and 2-hour postprandial sugars to be less than 160.  Elsie Sim Lent, MD

## 2024-03-28 LAB — BASIC METABOLIC PANEL WITH GFR
BUN: 20 mg/dL (ref 6–23)
CO2: 27 meq/L (ref 19–32)
Calcium: 9.2 mg/dL (ref 8.4–10.5)
Chloride: 105 meq/L (ref 96–112)
Creatinine, Ser: 1.13 mg/dL (ref 0.40–1.50)
GFR: 75.75 mL/min (ref >60.00)
Glucose, Bld: 85 mg/dL (ref 70–99)
Potassium: 3.9 meq/L (ref 3.5–5.1)
Sodium: 140 meq/L (ref 135–145)

## 2024-03-28 LAB — HEMOGLOBIN A1C: Hgb A1c MFr Bld: 6.5 % (ref 4.6–6.5)

## 2024-03-29 ENCOUNTER — Ambulatory Visit: Payer: Self-pay | Admitting: Family Medicine

## 2024-05-07 ENCOUNTER — Encounter: Attending: Family Medicine | Admitting: Dietician

## 2024-05-07 VITALS — Ht 66.0 in | Wt 200.4 lb

## 2024-05-07 DIAGNOSIS — E669 Obesity, unspecified: Secondary | ICD-10-CM | POA: Diagnosis present

## 2024-05-07 DIAGNOSIS — R7303 Prediabetes: Secondary | ICD-10-CM | POA: Diagnosis present

## 2024-05-07 NOTE — Patient Instructions (Addendum)
 Check your blood sugar each morning before eating or drinking (fasting). Look for numbers under 130 mg/dL, and trending down to between 70-100 mg/dL Check your blood sugar 2 hours after you begin eating a meal. Look for numbers under 180 mg/dL at all times.  Your goal A1c is below 6.5%  Switch to a skim milk for your protein shake, try switching to Farilife milk for less sugar and more protein.  Work on reducing your fat intake (fried chapati, egg yolks, fatty meats)  Eat consistently throughout the day and include small servings of carbohydrates paired with lean proteins and vegetables.

## 2024-05-07 NOTE — Progress Notes (Unsigned)
 Medical Nutrition Therapy  Appointment Start time:  [phone]  Appointment End time:  1650  Primary concerns today: Weight Loss, Glucose control  Referral diagnosis: R73.03 - Prediabetes, E66.01 - Obesity Preferred learning style: Auditory, Visual Learning readiness: Change in progress   NUTRITION ASSESSMENT   Anthropometrics Ht: 66 Wt: 200.4 lbs BMI: 32.35 kg/m2 Wt Change: +30 lbs in last 8 months  Clinical Medical Hx: Obesity, Prediabetes, Aortic Aneurysm, OSA Medications: Metformin  Labs: A1c - 6.5%, Notable Signs/Symptoms: N/A  Lifestyle & Dietary Hx Pt reports desire to reduce A1c and lose weight. Pt reports starting metformin , no GI side effects  Pt reports checking glucose at home, checks at random times before or after meals.  Pt reports following a low carb, high protein diet. Pt reports trying IF during July 5-6 days a week.  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 Pt reports being very active outside of work, doing resistance exercises at home and gardening/yard work.     Estimated daily fluid intake: 48 oz Supplements: B12, Vit D, CoQ10, Fish Oil Sleep: OSA on CPAP Stress / self-care: Mild Current average weekly physical activity: ADLs, lift weights at home 2-4 days a week, walks/yardwork  24-Hr Dietary Recall First Meal: Chapati (sometimes fried), 2 poached eggs, tea w/ Splenda Snack:  Second Meal: white rice, curry meat, vegetables Snack: Protein shake (Whey with whole milk) Third Meal: None Snack:  Beverages: Earl grey tea, water     NUTRITION DIAGNOSIS  {CHL AMB NUTRITIONAL [phone]}   NUTRITION INTERVENTION  Nutrition education (E-1) on the following topics:  Educated patient on the pathophysiology of diabetes. This includes why our bodies need circulating blood sugar, the relationship between insulin and blood sugar, and the results of insulin resistance and/or pancreatic insufficiency on the  development of diabetes. Educated patient on factors that contribute to elevation of blood sugars, such as stress, illness, injury,and food choices. Discussed the role that physical activity plays in lowering blood sugar. Educate patient on the three main macronutrients. Protein, fats, and carbohydrates. Discussed how each of these macronutrients affect blood sugar levels, especially carbohydrate, and the importance of eating a consistent amount of carbohydrate throughout the day.  Educated patient on the two components of energy balance: Energy in (calories), and energy out (activity). Explain the role of negative energy balance in weight loss. Discussed options with patient to achieve a negative energy balance and how to best control energy in and energy out to accommodate their lifestyle.  Handouts Provided Include  AVS  Learning Style & Readiness for Change Teaching method utilized: Visual & Auditory  Demonstrated degree of understanding via: Teach Back  Barriers to learning/adherence to lifestyle change: none  Goals Established by Pt Check your blood sugar each morning before eating or drinking (fasting). Look for numbers under 130 mg/dL, and trending down to between 70-100 mg/dL Check your blood sugar 2 hours after you begin eating a meal. Look for numbers under 180 mg/dL at all times. Your goal A1c is below 6.5% Switch to a skim milk for your protein shake, try switching to Farilife milk for less sugar and more protein. Work on reducing your fat intake (fried chapati, egg yolks, fatty meats) Eat consistently throughout the day and include small servings of carbohydrates paired with lean proteins and vegetables.   MONITORING & EVALUATION Dietary intake, weekly physical activity, blood glucose, and weight change in 3 months.  Next Steps  Patient is to bring glucometer to follow up  visit.

## 2024-05-08 ENCOUNTER — Encounter: Payer: Self-pay | Admitting: Dietician

## 2024-07-30 ENCOUNTER — Ambulatory Visit: Admitting: Dietician

## 2024-09-03 ENCOUNTER — Ambulatory Visit: Admitting: Family Medicine

## 2024-09-10 ENCOUNTER — Encounter: Payer: Self-pay | Admitting: Dietician

## 2024-09-10 ENCOUNTER — Encounter: Attending: Family Medicine | Admitting: Dietician

## 2024-09-10 VITALS — Ht 66.0 in | Wt 200.7 lb

## 2024-09-10 DIAGNOSIS — E669 Obesity, unspecified: Secondary | ICD-10-CM | POA: Insufficient documentation

## 2024-09-10 DIAGNOSIS — R7303 Prediabetes: Secondary | ICD-10-CM | POA: Diagnosis present

## 2024-09-10 NOTE — Progress Notes (Signed)
 Medical Nutrition Therapy  Appointment Start time:  [phone]  Appointment End time:  1450  Primary concerns today: Weight Loss, Glucose control  Referral diagnosis: R73.03 - Prediabetes, E66.01 - Obesity Preferred learning style: Auditory, Visual Learning readiness: Change in progress   NUTRITION ASSESSMENT   Anthropometrics Ht: 66 Wt: 200.7 lbs   BMI: 32.39 kg/m2 Wt Change: +30 lbs in last 8 months   Clinical Medical Hx: Obesity, Prediabetes, Aortic Aneurysm, OSA, HLD Medications: Metformin , Ezetimibe , Rosuvastatin , Lisinopril, Warfarin Labs: A1c - 6.5%, Notable Signs/Symptoms: N/A   Lifestyle & Dietary Hx Pt reports concern about not losing weight since last visit. Pt reports not checking glucose on a regular basis, FBG values ranging ~105-120 mg/dL when they do check. Pt reports making dietary changes to lower fat and be more consistent with carbs at mealtimes. Pt reports discontinuing IF after last visit. Pt reports going on vacation at the end of 2025, states they felt like they didn't stick to their diet while away. Pt states they are feeling a little more energetic, less fatigue, and going to sleep a little earlier since changing diet,  Pt reports resistance exercising 2-3 days a week for 60-75 minutes, walking on off days.   Estimated daily fluid intake: 48 oz Supplements: B12, Vit D, CoQ10, Fish Oil Sleep: OSA on CPAP Stress / self-care: Mild Current average weekly physical activity: ADLs, lift weights at home 2-4 days a week, walks/yardwork   24-Hr Dietary Recall First Meal: 2 Paratha, 2 fried eggs, vegetables (cauliflower, potatoes, carrots), tea w/ Milk Snack:  Second Meal: Salad (lettuce, cucumber, tomato, onion, cilantro) w/ turkey sausage, Corn/potato/chicken soup Snack: Small cup of coffee w/ skim milk Third Meal: 1 small naan, kebab, vegetables (cauliflower, potatoes, carrots) Snack:  Beverages: Tea, water, coffee     NUTRITION DIAGNOSIS  NB-1.1  Food and nutrition-related knowledge deficit As related to obesity/prediabetes.  As evidenced by elevated A1c, BMI of 32.35 kg/m2, irregular dietary pattern, fad dieting.   NUTRITION INTERVENTION  Nutrition education (E-1) on the following topics:  Educated patient on the pathophysiology of diabetes. This includes why our bodies need circulating blood sugar, the relationship between insulin and blood sugar, and the results of insulin resistance and/or pancreatic insufficiency on the development of diabetes. Educated patient on factors that contribute to elevation of blood sugars, such as stress, illness, injury,and food choices. Discussed the role that physical activity plays in lowering blood sugar. Educate patient on the three main macronutrients. Protein, fats, and carbohydrates. Discussed how each of these macronutrients affect blood sugar levels, especially carbohydrate, and the importance of eating a consistent amount of carbohydrate throughout the day.  Educated patient on the two components of energy balance: Energy in (calories), and energy out (activity). Explain the role of negative energy balance in weight loss. Discussed options with patient to achieve a negative energy balance and how to best control energy in and energy out to accommodate their lifestyle.  Handouts Provided Include  AVS  Learning Style & Readiness for Change Teaching method utilized: Visual & Auditory  Demonstrated degree of understanding via: Teach Back  Barriers to learning/adherence to lifestyle change: None  Goals Established by Pt When having breakfast, reduce your serving of paratha to 1 instead of 2, and if having toast, lower your slices of bread to 2-3 slices, having 3 on days when you will be exercising. Add back in your protein shake after exercise and use Farilife brand skim milk Have a small glass of fruit juice about half way  through your workout! Look for your glucose numbers to continue to trend  down towards 100 mg/dL.   MONITORING & EVALUATION Dietary intake, weekly physical activity, blood glucose, and weight change in 3 months.  Next Steps  Patient is to bring glucometer to follow up visit.

## 2024-09-10 NOTE — Patient Instructions (Addendum)
 When having breakfast, reduce your serving of paratha to 1 instead of 2, and if having toast, lower your slices of bread to 2-3 slices, having 3 on days when you will be exercising.  Add back in your protein shake after exercise and use Farilife brand skim milk  Have a small glass of fruit juice about half way through your workout!  Look for your glucose numbers to continue to trend down towards 100 mg/dL.

## 2024-09-12 ENCOUNTER — Ambulatory Visit: Admitting: Medical

## 2024-09-18 ENCOUNTER — Telehealth: Payer: 59 | Admitting: Adult Health

## 2024-09-20 ENCOUNTER — Ambulatory Visit: Admitting: Family Medicine

## 2024-09-20 ENCOUNTER — Encounter: Payer: Self-pay | Admitting: Family Medicine

## 2024-09-20 VITALS — BP 136/80 | HR 77 | Temp 97.9°F | Ht 66.0 in | Wt 202.4 lb

## 2024-09-20 DIAGNOSIS — Z23 Encounter for immunization: Secondary | ICD-10-CM | POA: Diagnosis not present

## 2024-09-20 DIAGNOSIS — R351 Nocturia: Secondary | ICD-10-CM | POA: Diagnosis not present

## 2024-09-20 DIAGNOSIS — Z0000 Encounter for general adult medical examination without abnormal findings: Secondary | ICD-10-CM

## 2024-09-20 DIAGNOSIS — Z6832 Body mass index (BMI) 32.0-32.9, adult: Secondary | ICD-10-CM | POA: Diagnosis not present

## 2024-09-20 DIAGNOSIS — E66811 Obesity, class 1: Secondary | ICD-10-CM | POA: Diagnosis not present

## 2024-09-20 DIAGNOSIS — H6123 Impacted cerumen, bilateral: Secondary | ICD-10-CM

## 2024-09-20 DIAGNOSIS — E6609 Other obesity due to excess calories: Secondary | ICD-10-CM | POA: Diagnosis not present

## 2024-09-20 DIAGNOSIS — E538 Deficiency of other specified B group vitamins: Secondary | ICD-10-CM

## 2024-09-20 DIAGNOSIS — Z0001 Encounter for general adult medical examination with abnormal findings: Secondary | ICD-10-CM

## 2024-09-20 DIAGNOSIS — Z131 Encounter for screening for diabetes mellitus: Secondary | ICD-10-CM | POA: Diagnosis not present

## 2024-09-20 DIAGNOSIS — N401 Enlarged prostate with lower urinary tract symptoms: Secondary | ICD-10-CM

## 2024-09-20 DIAGNOSIS — R7303 Prediabetes: Secondary | ICD-10-CM

## 2024-09-20 DIAGNOSIS — E785 Hyperlipidemia, unspecified: Secondary | ICD-10-CM

## 2024-09-20 MED ORDER — EAR WAX CLEANSING 6.5 % OT KIT: PACK | OTIC | 1 refills | Status: AC

## 2024-09-20 NOTE — Progress Notes (Signed)
 "  Established Patient Office Visit   Subjective:  Patient ID: Larry Parks, male    DOB: 06-27-1976  Age: 49 y.o. MRN: [id number]  Chief Complaint  Patient presents with   Annual Exam    HPI Encounter Diagnoses  Name Primary?   Healthcare maintenance Yes   Prediabetes    Dyslipidemia    Bilateral impacted cerumen    Screening for diabetes mellitus    B12 deficiency    Flu vaccine need    Benign prostatic hyperplasia with nocturia    Class 1 obesity due to excess calories with body mass index (BMI) of 32.0 to 32.9 in adult, unspecified whether serious comorbidity present    For physical and follow-up of above.  He is exercising 4-5 times weekly.  He has just scheduled deep cleaning for his teeth.  Continues metformin  at at bedtime for prediabetes.  He is working with a nutritionist for his diabetes and obesity.  Continues rosuvastatin  with Set[facility] for hyperlipidemia.  Continues tamsulosin  0.4 for BPH.  It is greatly helped his urine flow.  There is no longer nocturia unless he consumes fluids before bedtime.  Continue CPAP for apnea   Review of Systems  Constitutional: Negative.   HENT: Negative.    Eyes:  Negative for blurred vision, discharge and redness.  Respiratory: Negative.    Cardiovascular: Negative.   Gastrointestinal:  Negative for abdominal pain.  Genitourinary: Negative.   Musculoskeletal: Negative.  Negative for myalgias.  Skin:  Negative for rash.  Neurological:  Negative for tingling, loss of consciousness and weakness.  Endo/Heme/Allergies:  Negative for polydipsia.      09/20/2024    1:44 PM 05/07/2024    3:58 PM 07/07/2023    1:18 PM  Depression screen PHQ 2/9  Decreased Interest 0 0 0  Down, Depressed, Hopeless 0 0 0  PHQ - 2 Score 0 0 0  Altered sleeping 0  1  Tired, decreased energy 1  0  Change in appetite 0  0  Feeling bad or failure about yourself  0  0  Trouble concentrating 0  0  Moving slowly or fidgety/restless 0  0  Suicidal thoughts 0   0  PHQ-9 Score 1  1   Difficult doing work/chores Not difficult at all  Not difficult at all     Data saved with a previous flowsheet row definition     Current Medications[1]   Objective:     BP 136/80   Pulse 77   Temp 97.9 F (36.6 C)   Ht 5' 6 (1.676 m)   Wt 202 lb 6.4 oz (91.8 kg)   SpO2 96%   BMI 32.67 kg/m  Wt Readings from Last 3 Encounters:  09/20/24 202 lb 6.4 oz (91.8 kg)  09/10/24 200 lb 11.2 oz (91 kg)  05/07/24 200 lb 6.4 oz (90.9 kg)      Physical Exam Constitutional:      General: He is not in acute distress.    Appearance: Normal appearance. He is not ill-appearing, toxic-appearing or diaphoretic.  HENT:     Head: Normocephalic and atraumatic.     Right Ear: External ear normal. There is impacted cerumen.     Left Ear: External ear normal. There is impacted cerumen.     Mouth/Throat:     Mouth: Mucous membranes are moist.     Pharynx: Oropharynx is clear. No oropharyngeal exudate or posterior oropharyngeal erythema.  Eyes:     General: No scleral icterus.  Right eye: No discharge.        Left eye: No discharge.     Extraocular Movements: Extraocular movements intact.     Conjunctiva/sclera: Conjunctivae normal.     Pupils: Pupils are equal, round, and reactive to light.  Cardiovascular:     Rate and Rhythm: Normal rate and regular rhythm.  Pulmonary:     Effort: Pulmonary effort is normal. No respiratory distress.     Breath sounds: Normal breath sounds. No wheezing, rhonchi or rales.  Abdominal:     General: Bowel sounds are normal.     Tenderness: There is no abdominal tenderness. There is no guarding.     Hernia: A hernia is present. Hernia is present in the umbilical area and ventral area. There is no hernia in the left inguinal area or right inguinal area.  Genitourinary:    Penis: Circumcised. No hypospadias, erythema, tenderness, discharge, swelling or lesions.      Testes:        Right: Mass, tenderness or swelling not present.  Right testis is descended.        Left: Mass, tenderness or swelling not present. Left testis is descended.     Epididymis:     Right: Not inflamed or enlarged.     Left: Not inflamed or enlarged.  Musculoskeletal:     Cervical back: No rigidity or tenderness.  Lymphadenopathy:     Cervical: No cervical adenopathy.     Lower Body: No right inguinal adenopathy. No left inguinal adenopathy.  Skin:    General: Skin is warm and dry.  Neurological:     Mental Status: He is alert and oriented to person, place, and time.     Gait: Gait abnormal.  Psychiatric:        Mood and Affect: Mood normal.        Behavior: Behavior normal.      No results found for any visits on 09/20/24.    The 10-year ASCVD risk score (Arnett DK, et al., 2019) is: 2.9%    Assessment & Plan:   Healthcare maintenance -     CBC with Differential/Platelet; Future -     Urinalysis, Routine w reflex microscopic; Future  Prediabetes -     Comprehensive metabolic panel with GFR; Future -     Hemoglobin A1c; Future  Dyslipidemia -     Comprehensive metabolic panel with GFR; Future -     Lipid panel; Future  Bilateral impacted cerumen -     Ear Wax Cleansing; Follow kit directions  Dispense: 1 kit; Refill: 1  Screening for diabetes mellitus  B12 deficiency -     Vitamin B12; Future  Flu vaccine need -     Flu vaccine trivalent PF, 6mos and older(Flulaval,Afluria,Fluarix,Fluzone)  Benign prostatic hyperplasia with nocturia -     PSA; Future  Class 1 obesity due to excess calories with body mass index (BMI) of 32.0 to 32.9 in adult, unspecified whether serious comorbidity present     Return in about 6 months (around 03/20/2025), or Return fasting for ordered blood work please, for chronic disease follow-up.  Continue working with nutritionist.  Continue regular exercise.  Continue weight loss efforts.  Information given on ceruminosis prescription for an earwax kit was sent.  Adjustments made to  metformin  dosing pending results of A1c.   Elsie Sim Lent, MD    [1]  Current Outpatient Medications:    Carbamide Peroxide-Saline (EAR WAX CLEANSING) 6.5 % KIT, Follow kit directions, Disp: 1  kit, Rfl: 1   Cholecalciferol (VITAMIN D3) 125 MCG (5000 UT) CAPS, Take 1 capsule by mouth daily., Disp: , Rfl:    co-enzyme Q-10 30 MG capsule, Take 30 mg by mouth 3 (three) times daily., Disp: , Rfl:    cyanocobalamin  100 MCG tablet, Take 100 mcg by mouth daily., Disp: , Rfl:    ezetimibe  (ZETIA ) 10 MG tablet, TAKE 1 TABLET (10 MG TOTAL) BY MOUTH DAILY., Disp: 90 tablet, Rfl: 3   lisinopril (ZESTRIL) 5 MG tablet, Take 5 mg by mouth daily., Disp: , Rfl:    melatonin 3 MG TABS tablet, Take 1 mg by mouth at bedtime., Disp: , Rfl:    metFORMIN  (GLUCOPHAGE -XR) 500 MG 24 hr tablet, Take 1 tablet (500 mg total) by mouth at bedtime., Disp: 90 tablet, Rfl: 1   Omega-3 Fatty Acids (FISH OIL PO), Take 1 tablet by mouth daily. 2 capsules twice a day, Disp: , Rfl:    rosuvastatin  (CRESTOR ) 10 MG tablet, TAKE 1 TABLET (10 MG TOTAL) BY MOUTH DAILY., Disp: 30 tablet, Rfl: 0   tamsulosin  (FLOMAX ) 0.4 MG CAPS capsule, Take 1 capsule (0.4 mg total) by mouth daily., Disp: 90 capsule, Rfl: 3   warfarin (COUMADIN ) 5 MG tablet, TAKE 1 TABLET DAILY OR AS DIRECTED BY ANTICOAGULATION CLINIC., Disp: 100 tablet, Rfl: 1   diclofenac  Sodium (VOLTAREN ) 1 % GEL, Apply a small grape sized dollop to sore place on foot 3 times daily as needed. (Patient not taking: Reported on 09/20/2024), Disp: 150 g, Rfl: 2  "

## 2024-09-25 ENCOUNTER — Other Ambulatory Visit

## 2024-09-25 DIAGNOSIS — E538 Deficiency of other specified B group vitamins: Secondary | ICD-10-CM

## 2024-09-25 DIAGNOSIS — R7303 Prediabetes: Secondary | ICD-10-CM

## 2024-09-25 DIAGNOSIS — N401 Enlarged prostate with lower urinary tract symptoms: Secondary | ICD-10-CM

## 2024-09-25 DIAGNOSIS — E785 Hyperlipidemia, unspecified: Secondary | ICD-10-CM

## 2024-09-25 DIAGNOSIS — R351 Nocturia: Secondary | ICD-10-CM | POA: Diagnosis not present

## 2024-09-25 DIAGNOSIS — Z0000 Encounter for general adult medical examination without abnormal findings: Secondary | ICD-10-CM | POA: Diagnosis not present

## 2024-09-25 LAB — COMPREHENSIVE METABOLIC PANEL WITH GFR
ALT: 21 U/L (ref 3–53)
AST: 18 U/L (ref 5–37)
Albumin: 4.3 g/dL (ref 3.5–5.2)
Alkaline Phosphatase: 54 U/L (ref 39–117)
BUN: 18 mg/dL (ref 6–23)
CO2: 27 meq/L (ref 19–32)
Calcium: 9.3 mg/dL (ref 8.4–10.5)
Chloride: 104 meq/L (ref 96–112)
Creatinine, Ser: 0.95 mg/dL (ref 0.40–1.50)
GFR: 92.96 mL/min
Glucose, Bld: 89 mg/dL (ref 70–99)
Potassium: 3.7 meq/L (ref 3.5–5.1)
Sodium: 141 meq/L (ref 135–145)
Total Bilirubin: 0.3 mg/dL (ref 0.2–1.2)
Total Protein: 6.6 g/dL (ref 6.0–8.3)

## 2024-09-25 LAB — CBC WITH DIFFERENTIAL/PLATELET
Basophils Absolute: 0 10*3/uL (ref 0.0–0.1)
Basophils Relative: 0.3 % (ref 0.0–3.0)
Eosinophils Absolute: 0 10*3/uL (ref 0.0–0.7)
Eosinophils Relative: 0.5 % (ref 0.0–5.0)
HCT: 41.5 % (ref 39.0–52.0)
Hemoglobin: 13.7 g/dL (ref 13.0–17.0)
Lymphocytes Relative: 25.7 % (ref 12.0–46.0)
Lymphs Abs: 1.9 10*3/uL (ref 0.7–4.0)
MCHC: 32.9 g/dL (ref 30.0–36.0)
MCV: 84.8 fl (ref 78.0–100.0)
Monocytes Absolute: 0.5 10*3/uL (ref 0.1–1.0)
Monocytes Relative: 7.4 % (ref 3.0–12.0)
Neutro Abs: 4.9 10*3/uL (ref 1.4–7.7)
Neutrophils Relative %: 66.1 % (ref 43.0–77.0)
Platelets: 195 10*3/uL (ref 150.0–400.0)
RBC: 4.9 Mil/uL (ref 4.22–5.81)
RDW: 15 % (ref 11.5–15.5)
WBC: 7.4 10*3/uL (ref 4.0–10.5)

## 2024-09-25 LAB — HEMOGLOBIN A1C: Hgb A1c MFr Bld: 6.6 % — ABNORMAL HIGH (ref 4.6–6.5)

## 2024-09-25 LAB — URINALYSIS, ROUTINE W REFLEX MICROSCOPIC
Bilirubin Urine: NEGATIVE
Hgb urine dipstick: NEGATIVE
Ketones, ur: NEGATIVE
Leukocytes,Ua: NEGATIVE
Nitrite: NEGATIVE
Specific Gravity, Urine: 1.02 (ref 1.000–1.030)
Total Protein, Urine: NEGATIVE
Urine Glucose: NEGATIVE
Urobilinogen, UA: 0.2 (ref 0.0–1.0)
WBC, UA: NONE SEEN — AB
pH: 6 (ref 5.0–8.0)

## 2024-09-25 LAB — LIPID PANEL
Cholesterol: 125 mg/dL (ref 28–200)
HDL: 43.7 mg/dL
LDL Cholesterol: 51 mg/dL (ref 10–99)
NonHDL: 81.62
Total CHOL/HDL Ratio: 3
Triglycerides: 155 mg/dL — ABNORMAL HIGH (ref 10.0–149.0)
VLDL: 31 mg/dL (ref 0.0–40.0)

## 2024-09-25 LAB — VITAMIN B12: Vitamin B-12: 526 pg/mL (ref 211–911)

## 2024-09-25 LAB — PSA: PSA: 0.7 ng/mL (ref 0.10–4.00)

## 2024-09-26 ENCOUNTER — Ambulatory Visit: Payer: Self-pay | Admitting: Family Medicine

## 2024-09-27 ENCOUNTER — Other Ambulatory Visit: Payer: Self-pay | Admitting: Family Medicine

## 2024-09-27 DIAGNOSIS — E785 Hyperlipidemia, unspecified: Secondary | ICD-10-CM

## 2024-09-28 ENCOUNTER — Other Ambulatory Visit: Payer: Self-pay | Admitting: Family Medicine

## 2024-09-28 DIAGNOSIS — R7303 Prediabetes: Secondary | ICD-10-CM

## 2024-11-01 ENCOUNTER — Telehealth: Admitting: Adult Health

## 2024-12-10 ENCOUNTER — Encounter: Admitting: Dietician
